# Patient Record
Sex: Female | Born: 1955 | Race: White | Hispanic: No | Marital: Single | Smoking: Current every day smoker
Health system: Southern US, Community
[De-identification: ages and names within clinical notes are randomized; demographics above are authoritative.]

## PROBLEM LIST (undated history)

## (undated) DIAGNOSIS — K741 Hepatic sclerosis: Secondary | ICD-10-CM

## (undated) DIAGNOSIS — G629 Polyneuropathy, unspecified: Secondary | ICD-10-CM

## (undated) DIAGNOSIS — J45909 Unspecified asthma, uncomplicated: Secondary | ICD-10-CM

## (undated) DIAGNOSIS — J449 Chronic obstructive pulmonary disease, unspecified: Secondary | ICD-10-CM

## (undated) DIAGNOSIS — E079 Disorder of thyroid, unspecified: Secondary | ICD-10-CM

## (undated) HISTORY — PX: RADICAL HYSTERECTOMY: SHX2283

---

## 2014-04-06 LAB — CBC WITH DIFFERENTIAL/PLATELET
Basophil #: 0 10*3/uL (ref 0.0–0.1)
Basophil %: 1 %
Eosinophil #: 0.1 10*3/uL (ref 0.0–0.7)
Eosinophil %: 2.3 %
HCT: 35.4 % (ref 35.0–47.0)
HGB: 11.8 g/dL — ABNORMAL LOW (ref 12.0–16.0)
Lymphocyte #: 1.3 10*3/uL (ref 1.0–3.6)
Lymphocyte %: 26 %
MCH: 32.1 pg (ref 26.0–34.0)
MCHC: 33.5 g/dL (ref 32.0–36.0)
MCV: 96 fL (ref 80–100)
MONOS PCT: 7.2 %
Monocyte #: 0.4 x10 3/mm (ref 0.2–0.9)
NEUTROS ABS: 3.2 10*3/uL (ref 1.4–6.5)
NEUTROS PCT: 63.5 %
Platelet: 191 10*3/uL (ref 150–440)
RBC: 3.69 10*6/uL — ABNORMAL LOW (ref 3.80–5.20)
RDW: 14.6 % — ABNORMAL HIGH (ref 11.5–14.5)
WBC: 5 10*3/uL (ref 3.6–11.0)

## 2014-04-06 LAB — URINALYSIS, COMPLETE
Bacteria: NONE SEEN
Bilirubin,UR: NEGATIVE
Blood: NEGATIVE
Glucose,UR: NEGATIVE mg/dL (ref 0–75)
KETONE: NEGATIVE
LEUKOCYTE ESTERASE: NEGATIVE
NITRITE: NEGATIVE
PROTEIN: NEGATIVE
Ph: 6 (ref 4.5–8.0)
RBC,UR: NONE SEEN /HPF (ref 0–5)
Specific Gravity: 1.008 (ref 1.003–1.030)
Squamous Epithelial: NONE SEEN

## 2014-04-06 LAB — COMPREHENSIVE METABOLIC PANEL
ALT: 26 U/L (ref 12–78)
Albumin: 3.3 g/dL — ABNORMAL LOW (ref 3.4–5.0)
Alkaline Phosphatase: 95 U/L
Anion Gap: 9 (ref 7–16)
BUN: 12 mg/dL (ref 7–18)
Bilirubin,Total: 0.3 mg/dL (ref 0.2–1.0)
Calcium, Total: 8.3 mg/dL — ABNORMAL LOW (ref 8.5–10.1)
Chloride: 100 mmol/L (ref 98–107)
Co2: 22 mmol/L (ref 21–32)
Creatinine: 0.7 mg/dL (ref 0.60–1.30)
EGFR (Non-African Amer.): >60
Glucose: 89 mg/dL (ref 65–99)
Osmolality: 262 (ref 275–301)
POTASSIUM: 3.7 mmol/L (ref 3.5–5.1)
SGOT(AST): 42 U/L — ABNORMAL HIGH (ref 15–37)
Sodium: 131 mmol/L — ABNORMAL LOW (ref 136–145)
Total Protein: 6.7 g/dL (ref 6.4–8.2)

## 2014-04-06 LAB — PROTIME-INR
INR: 1.2
Prothrombin Time: 15.2 secs — ABNORMAL HIGH (ref 11.5–14.7)

## 2014-04-06 LAB — DRUG SCREEN, URINE

## 2014-04-06 LAB — APTT: Activated PTT: 30.9 secs (ref 23.6–35.9)

## 2014-04-06 LAB — OSMOLALITY: Osmolality: 322 mOsm/kg (ref 275–295)

## 2014-04-06 LAB — ETHANOL
Ethanol %: 0.24 % — ABNORMAL HIGH (ref 0.000–0.080)
Ethanol: 240 mg/dL

## 2014-04-06 LAB — TROPONIN I: Troponin-I: <0.02 ng/mL

## 2014-04-06 LAB — AMMONIA: Ammonia, Plasma: 78 mcmol/L — ABNORMAL HIGH (ref 11–32)

## 2014-04-07 ENCOUNTER — Inpatient Hospital Stay: Payer: Self-pay | Admitting: Internal Medicine

## 2014-04-07 LAB — CK TOTAL AND CKMB (NOT AT ARMC)
CK, TOTAL: 104 U/L
CK, TOTAL: 81 U/L
CK-MB: 1.3 ng/mL (ref 0.5–3.6)
CK-MB: 0.9 ng/mL (ref 0.5–3.6)

## 2014-04-07 LAB — TROPONIN I: Troponin-I: <0.02 ng/mL

## 2014-04-07 LAB — ACETAMINOPHEN LEVEL: Acetaminophen: <2 ug/mL

## 2014-04-07 LAB — SALICYLATE LEVEL: SALICYLATES, SERUM: 2.1 mg/dL

## 2014-04-08 LAB — CBC WITH DIFFERENTIAL/PLATELET
BASOS ABS: 0 10*3/uL (ref 0.0–0.1)
Basophil %: 0.9 %
EOS ABS: 0.1 10*3/uL (ref 0.0–0.7)
EOS PCT: 2.2 %
HCT: 35.7 % (ref 35.0–47.0)
HGB: 11.8 g/dL — AB (ref 12.0–16.0)
Lymphocyte #: 1 10*3/uL (ref 1.0–3.6)
Lymphocyte %: 22.4 %
MCH: 32.2 pg (ref 26.0–34.0)
MCHC: 33.2 g/dL (ref 32.0–36.0)
MCV: 97 fL (ref 80–100)
Monocyte #: 0.3 x10 3/mm (ref 0.2–0.9)
Monocyte %: 7.2 %
NEUTROS PCT: 67.3 %
Neutrophil #: 3 10*3/uL (ref 1.4–6.5)
PLATELETS: 137 10*3/uL — AB (ref 150–440)
RBC: 3.68 10*6/uL — ABNORMAL LOW (ref 3.80–5.20)
RDW: 14.6 % — ABNORMAL HIGH (ref 11.5–14.5)
WBC: 4.4 10*3/uL (ref 3.6–11.0)

## 2014-04-08 LAB — BASIC METABOLIC PANEL
ANION GAP: 2 — AB (ref 7–16)
BUN: 5 mg/dL — ABNORMAL LOW (ref 7–18)
CO2: 23 mmol/L (ref 21–32)
CREATININE: 0.81 mg/dL (ref 0.60–1.30)
Calcium, Total: 7.7 mg/dL — ABNORMAL LOW (ref 8.5–10.1)
Chloride: 110 mmol/L — ABNORMAL HIGH (ref 98–107)
EGFR (Non-African Amer.): >60
Glucose: 80 mg/dL (ref 65–99)
Osmolality: 266 (ref 275–301)
POTASSIUM: 3.3 mmol/L — AB (ref 3.5–5.1)
SODIUM: 135 mmol/L — AB (ref 136–145)

## 2014-04-08 LAB — COMPREHENSIVE METABOLIC PANEL
ALK PHOS: 78 U/L
AST: 25 U/L (ref 15–37)
Albumin: 2.5 g/dL — ABNORMAL LOW (ref 3.4–5.0)
Bilirubin,Total: 0.7 mg/dL (ref 0.2–1.0)
SGPT (ALT): 16 U/L (ref 12–78)
Total Protein: 5.9 g/dL — ABNORMAL LOW (ref 6.4–8.2)

## 2014-04-08 LAB — MAGNESIUM: Magnesium: 1.4 mg/dL — ABNORMAL LOW

## 2014-04-08 LAB — TSH: Thyroid Stimulating Horm: 0.53 u[IU]/mL

## 2014-04-08 LAB — VANCOMYCIN, TROUGH: Vancomycin, Trough: 17 ug/mL (ref 10–20)

## 2014-04-11 LAB — CREATININE, SERUM
Creatinine: 0.84 mg/dL (ref 0.60–1.30)
EGFR (African American): >60
EGFR (Non-African Amer.): >60

## 2014-05-03 ENCOUNTER — Emergency Department: Payer: Self-pay | Admitting: Emergency Medicine

## 2014-05-03 LAB — CBC
HCT: 38 % (ref 35.0–47.0)
HGB: 12.5 g/dL (ref 12.0–16.0)
MCH: 31.9 pg (ref 26.0–34.0)
MCHC: 33 g/dL (ref 32.0–36.0)
MCV: 97 fL (ref 80–100)
Platelet: 147 10*3/uL — ABNORMAL LOW (ref 150–440)
RBC: 3.93 10*6/uL (ref 3.80–5.20)
RDW: 13.6 % (ref 11.5–14.5)
WBC: 4.7 10*3/uL (ref 3.6–11.0)

## 2014-05-03 LAB — COMPREHENSIVE METABOLIC PANEL
ALT: 33 U/L (ref 12–78)
Albumin: 3.8 g/dL (ref 3.4–5.0)
Alkaline Phosphatase: 98 U/L
Anion Gap: 8 (ref 7–16)
BUN: 10 mg/dL (ref 7–18)
Bilirubin,Total: 0.4 mg/dL (ref 0.2–1.0)
CALCIUM: 8.6 mg/dL (ref 8.5–10.1)
CHLORIDE: 100 mmol/L (ref 98–107)
Co2: 23 mmol/L (ref 21–32)
Creatinine: 0.65 mg/dL (ref 0.60–1.30)
EGFR (African American): >60
EGFR (Non-African Amer.): >60
Glucose: 87 mg/dL (ref 65–99)
Osmolality: 261 (ref 275–301)
POTASSIUM: 3.7 mmol/L (ref 3.5–5.1)
SGOT(AST): 27 U/L (ref 15–37)
SODIUM: 131 mmol/L — AB (ref 136–145)
Total Protein: 7.3 g/dL (ref 6.4–8.2)

## 2014-05-03 LAB — DRUG SCREEN, URINE
Amphetamines, Ur Screen: NEGATIVE (ref ?–1000)
Barbiturates, Ur Screen: NEGATIVE (ref ?–200)
Benzodiazepine, Ur Scrn: NEGATIVE (ref ?–200)
Cannabinoid 50 Ng, Ur ~~LOC~~: NEGATIVE (ref ?–50)
Cocaine Metabolite,Ur ~~LOC~~: NEGATIVE (ref ?–300)
MDMA (ECSTASY) UR SCREEN: NEGATIVE (ref ?–500)
METHADONE, UR SCREEN: NEGATIVE (ref ?–300)
Opiate, Ur Screen: NEGATIVE (ref ?–300)
Phencyclidine (PCP) Ur S: NEGATIVE (ref ?–25)
Tricyclic, Ur Screen: NEGATIVE (ref ?–1000)

## 2014-05-03 LAB — URINALYSIS, COMPLETE
BACTERIA: NONE SEEN
Bilirubin,UR: NEGATIVE
Blood: NEGATIVE
Glucose,UR: NEGATIVE mg/dL (ref 0–75)
KETONE: NEGATIVE
LEUKOCYTE ESTERASE: NEGATIVE
NITRITE: NEGATIVE
PH: 6 (ref 4.5–8.0)
PROTEIN: NEGATIVE
SPECIFIC GRAVITY: 1.013 (ref 1.003–1.030)
WBC UR: 1 /HPF (ref 0–5)

## 2014-05-03 LAB — ACETAMINOPHEN LEVEL

## 2014-05-03 LAB — ETHANOL
ETHANOL %: 0.265 % — AB (ref 0.000–0.080)
ETHANOL LVL: 265 mg/dL

## 2014-05-03 LAB — SALICYLATE LEVEL: Salicylates, Serum: 2.5 mg/dL

## 2014-05-25 ENCOUNTER — Emergency Department: Payer: Self-pay | Admitting: Emergency Medicine

## 2014-06-05 ENCOUNTER — Emergency Department (HOSPITAL_COMMUNITY): Payer: Medicaid Other

## 2014-06-05 ENCOUNTER — Encounter (HOSPITAL_COMMUNITY): Payer: Self-pay | Admitting: Emergency Medicine

## 2014-06-05 ENCOUNTER — Emergency Department (HOSPITAL_COMMUNITY)
Admission: EM | Admit: 2014-06-05 | Discharge: 2014-06-05 | Disposition: A | Discharge status: Home / Self Care | Payer: Medicaid Other | Attending: Emergency Medicine | Admitting: Emergency Medicine

## 2014-06-05 DIAGNOSIS — R296 Repeated falls: Secondary | ICD-10-CM | POA: Diagnosis not present

## 2014-06-05 DIAGNOSIS — Z79899 Other long term (current) drug therapy: Secondary | ICD-10-CM | POA: Insufficient documentation

## 2014-06-05 DIAGNOSIS — Z8719 Personal history of other diseases of the digestive system: Secondary | ICD-10-CM | POA: Diagnosis not present

## 2014-06-05 DIAGNOSIS — F17200 Nicotine dependence, unspecified, uncomplicated: Secondary | ICD-10-CM | POA: Insufficient documentation

## 2014-06-05 DIAGNOSIS — Z7901 Long term (current) use of anticoagulants: Secondary | ICD-10-CM | POA: Insufficient documentation

## 2014-06-05 DIAGNOSIS — G608 Other hereditary and idiopathic neuropathies: Secondary | ICD-10-CM | POA: Diagnosis not present

## 2014-06-05 DIAGNOSIS — S79919A Unspecified injury of unspecified hip, initial encounter: Secondary | ICD-10-CM | POA: Insufficient documentation

## 2014-06-05 DIAGNOSIS — S72111A Displaced fracture of greater trochanter of right femur, initial encounter for closed fracture: Secondary | ICD-10-CM

## 2014-06-05 DIAGNOSIS — J449 Chronic obstructive pulmonary disease, unspecified: Secondary | ICD-10-CM | POA: Diagnosis not present

## 2014-06-05 DIAGNOSIS — Y929 Unspecified place or not applicable: Secondary | ICD-10-CM | POA: Insufficient documentation

## 2014-06-05 DIAGNOSIS — IMO0002 Reserved for concepts with insufficient information to code with codable children: Secondary | ICD-10-CM | POA: Insufficient documentation

## 2014-06-05 DIAGNOSIS — S79929A Unspecified injury of unspecified thigh, initial encounter: Secondary | ICD-10-CM | POA: Diagnosis present

## 2014-06-05 DIAGNOSIS — J4489 Other specified chronic obstructive pulmonary disease: Secondary | ICD-10-CM | POA: Insufficient documentation

## 2014-06-05 DIAGNOSIS — Y9389 Activity, other specified: Secondary | ICD-10-CM | POA: Diagnosis not present

## 2014-06-05 DIAGNOSIS — E079 Disorder of thyroid, unspecified: Secondary | ICD-10-CM | POA: Insufficient documentation

## 2014-06-05 DIAGNOSIS — S72109A Unspecified trochanteric fracture of unspecified femur, initial encounter for closed fracture: Secondary | ICD-10-CM | POA: Insufficient documentation

## 2014-06-05 HISTORY — DX: Disorder of thyroid, unspecified: E07.9

## 2014-06-05 HISTORY — DX: Polyneuropathy, unspecified: G62.9

## 2014-06-05 HISTORY — DX: Hepatic sclerosis: K74.1

## 2014-06-05 HISTORY — DX: Chronic obstructive pulmonary disease, unspecified: J44.9

## 2014-06-05 HISTORY — DX: Unspecified asthma, uncomplicated: J45.909

## 2014-06-05 MED ORDER — OXYCODONE-ACETAMINOPHEN 5-325 MG PO TABS: 1.0000 | ORAL_TABLET | ORAL | Status: AC | PRN

## 2014-06-05 MED ORDER — OXYCODONE-ACETAMINOPHEN 5-325 MG PO TABS: 2.0000 | ORAL_TABLET | ORAL | Status: DC | PRN

## 2014-06-05 MED ORDER — OXYCODONE-ACETAMINOPHEN 5-325 MG PO TABS
1.0000 | ORAL_TABLET | Freq: Once | ORAL | Status: AC
  Administered 2014-06-05: 1 via ORAL
  Filled 2014-06-05: qty 1

## 2014-06-05 NOTE — ED Notes (Signed)
Pt states she fell on 7/16 and continues to have pain to right hip since, states xrays done the next day and was told that it was negative for fx, pt uses a walker, pt from assisted living, states Tramadol is not working for pain

## 2014-06-05 NOTE — ED Notes (Signed)
Pt seen at [facility] for fall 10 days ago, co rt hip pain, all scans negative for fracture. Pt states her pain continues.

## 2014-06-05 NOTE — ED Provider Notes (Signed)
CSN: [id number]     Arrival date & time 06/05/14  1609 History   First MD Initiated Contact with Patient 06/05/14 1804     Chief Complaint  Patient presents with  . Hip Pain    rt     (Consider location/radiation/quality/duration/timing/severity/associated sxs/prior Treatment) Patient is a 58 y.o. female presenting with hip pain. The history is provided by the patient.  Hip Pain The current episode started 1 to 4 weeks ago. The problem occurs constantly. Pertinent negatives include no abdominal pain, chills, fever, headaches, nausea, rash or vomiting.   [name] is a 58 y.o. female who presents to the ED with right hip pain tat started 10 days ago after a fall. She was evaluated at [facility] and she states that all the x-rays were negative for fracture. She continues to have pain in the right hip. She states that on occasion her right knee gives out due to an old injury and she falls. When she fell 10 days ago that is what happened. She was walking and her knee gave out and she fell on a hard floor. She has been taking Tramadol for pain which did not help. She was referred to an orthopedic doctor. She is living in assisted living and they have not scheduled her for the follow up yet. The patient states she is out of the Tramadol and the pain is severe in the right hip. Patient states she is unable to walk due to pain.  Arrived here today via EMS.  A staff member from the assisted living facility where the patient lives is here with the patient. The staff member thinks the patient just wants stronger pain medication.   Past Medical History  Diagnosis Date  . Hepatic sclerosis   . Neuropathy   . COPD (chronic obstructive pulmonary disease)   . Asthma   . Thyroid disease    Past Surgical History  Procedure Laterality Date  . Radical hysterectomy     History reviewed. No pertinent family history. History  Substance Use Topics  . Smoking status: Current Every Day Smoker -- 0.50  packs/day for 25 years    Types: Cigarettes  . Smokeless tobacco: Not on file  . Alcohol Use: Yes   OB History   Grav Para Term Preterm Abortions TAB SAB Ect Mult Living                 Review of Systems  Constitutional: Negative for fever and chills.  HENT: Negative.   Eyes: Negative for visual disturbance.  Respiratory: Negative for shortness of breath and wheezing.   Gastrointestinal: Negative for nausea, vomiting and abdominal pain.  Genitourinary: Negative for dysuria and frequency.  Musculoskeletal: Positive for back pain.       Right hip pain that radiates to the right upper back.   Skin: Negative for rash.  Neurological: Negative for dizziness, syncope and headaches.  Psychiatric/Behavioral: Negative for confusion. The patient is not nervous/anxious.       Allergies  Review of patient's allergies indicates no known allergies.  Home Medications   Prior to Admission medications   Medication Sig Start Date End Date Taking? Authorizing Provider  albuterol (PROAIR HFA) 108 (90 BASE) MCG/ACT inhaler Inhale 1-2 puffs into the lungs every 6 (six) hours as needed for wheezing or shortness of breath.   Yes Historical Provider, MD  albuterol (PROVENTIL) (2.5 MG/3ML) 0.083% nebulizer solution Take 2.5 mg by nebulization 4 (four) times daily as needed for wheezing or shortness of breath.  Yes Historical Provider, MD  DiphenhydrAMINE HCl (COMPLETE ALLERGY PO) Take 2 tablets by mouth daily as needed (for allergies).   Yes Historical Provider, MD  docusate sodium (COLACE) 100 MG capsule Take 100 mg by mouth 2 (two) times daily as needed for mild constipation.   Yes Historical Provider, MD  escitalopram (LEXAPRO) 20 MG tablet Take 20 mg by mouth daily.   Yes Historical Provider, MD  gabapentin (NEURONTIN) 600 MG tablet Take 600 mg by mouth 3 (three) times daily.   Yes Historical Provider, MD  levothyroxine (SYNTHROID, LEVOTHROID) 75 MCG tablet Take 75 mcg by mouth daily before  breakfast.   Yes Historical Provider, MD  LORazepam (ATIVAN) 0.5 MG tablet Take 0.5 mg by mouth 3 (three) times daily.   Yes Historical Provider, MD  magnesium oxide (MAG-OX) 400 MG tablet Take 400 mg by mouth 2 (two) times daily.   Yes Historical Provider, MD  QUEtiapine (SEROQUEL) 300 MG tablet Take 300 mg by mouth at bedtime.   Yes Historical Provider, MD  rOPINIRole (REQUIP) 0.5 MG tablet Take 0.5 mg by mouth at bedtime.   Yes Historical Provider, MD  Tiotropium Bromide Monohydrate (SPIRIVA RESPIMAT) 2.5 MCG/ACT AERS Inhale 2 puffs into the lungs daily.   Yes Historical Provider, MD  traMADol (ULTRAM) 50 MG tablet Take 50 mg by mouth every 4 (four) hours.   Yes Historical Provider, MD  warfarin (COUMADIN) 5 MG tablet Take 5-7.5 mg by mouth See admin instructions. Takes one tablet (5mg  total) on Tuesdays, Thursdays, and Saturdays, then takes one and one-half tablet on Mondays, Wednesdays, and Fridays.   Yes Historical Provider, MD   BP 150/97  Pulse 69  Temp(Src) 98.6 F (37 C) (Oral)  Resp 17  Ht 5\' 5"  (1.651 m)  Wt 136 lb (61.689 kg)  BMI 22.63 kg/m2  SpO2 97% Physical Exam  Nursing note and vitals reviewed. Constitutional: She is oriented to person, place, and time. She appears well-developed and well-nourished.  HENT:  Head: Normocephalic and atraumatic.  Eyes: Conjunctivae and EOM are normal.  Neck: Neck supple.  Cardiovascular: Normal rate.   Pulmonary/Chest: Effort normal.  Abdominal: Soft. Bowel sounds are normal. There is no tenderness.  Musculoskeletal:       Right hip: She exhibits decreased range of motion, decreased strength, tenderness and bony tenderness.  Patient has severe pain in the right hip with flexion of the knee and with ranger of motion. Pedal pulses equal, adequate circulation, good touch sensation.   Neurological: She is alert and oriented to person, place, and time. No cranial nerve deficit or sensory deficit. Gait abnormal.  Requires a walker for  ambulation due to pain of right hip. Walks with limp.   Skin: Skin is warm and dry.  Psychiatric: She has a normal mood and affect. Her behavior is normal.    ED Course  Procedures  Ct Hip Right Wo Contrast  06/05/2014   CLINICAL DATA:  Right hip pain secondary to a fall on 05/25/2014.  EXAM: CT OF THE RIGHT HIP WITHOUT CONTRAST  TECHNIQUE: Multidetector CT imaging was performed according to the standard protocol. Multiplanar CT image reconstructions were also generated.  COMPARISON:  Radiographs dated 05/25/2014  FINDINGS: There is a comminuted minimally displaced fracture of the superior aspect of right greater trochanter. The fracture does not extend through the intertrochanteric region. The femoral neck and head and acetabulum are normal.  There is some edema/hemorrhage in the soft tissues around the fracture.  IMPRESSION: Comminuted minimally displaced fracture of  the tip of the right greater trochanter.   Electronically Signed   By: Geanie Cooley M.D.   On: 06/05/2014 19:17    MDM  58 y.o. female with right hip pain s/p fall 10 days ago. Will treat for pain for fracture of the greater trochanter. She is to follow up with Dr. Hilda Lias. Pain management, walker to assist with ambulation, limited weight bearing. Discussed with the patient and all questioned fully answered. She will return if any problems arise. Stable for return to assisted living. BP 148/81  Pulse 55  Temp(Src) 98.6 F (37 C) (Oral)  Resp 18  Ht 5\' 5"  (1.651 m)  Wt 136 lb (61.689 kg)  BMI 22.63 kg/m2  SpO2 96%    Medication List    TAKE these medications       oxyCODONE-acetaminophen 5-325 MG per tablet  Commonly known as:  PERCOCET/ROXICET  Take 2 tablets by mouth every 4 (four) hours as needed for moderate pain or severe pain.     oxyCODONE-acetaminophen 5-325 MG per tablet  Commonly known as:  ROXICET  Take 1 tablet by mouth every 4 (four) hours as needed for severe pain.      ASK your doctor about these  medications       COMPLETE ALLERGY PO  Take 2 tablets by mouth daily as needed (for allergies).     docusate sodium 100 MG capsule  Commonly known as:  COLACE  Take 100 mg by mouth 2 (two) times daily as needed for mild constipation.     escitalopram 20 MG tablet  Commonly known as:  LEXAPRO  Take 20 mg by mouth daily.     gabapentin 600 MG tablet  Commonly known as:  NEURONTIN  Take 600 mg by mouth 3 (three) times daily.     levothyroxine 75 MCG tablet  Commonly known as:  SYNTHROID, LEVOTHROID  Take 75 mcg by mouth daily before breakfast.     LORazepam 0.5 MG tablet  Commonly known as:  ATIVAN  Take 0.5 mg by mouth 3 (three) times daily.     magnesium oxide 400 MG tablet  Commonly known as:  MAG-OX  Take 400 mg by mouth 2 (two) times daily.     PROAIR HFA 108 (90 BASE) MCG/ACT inhaler  Generic drug:  albuterol  Inhale 1-2 puffs into the lungs every 6 (six) hours as needed for wheezing or shortness of breath.     albuterol (2.5 MG/3ML) 0.083% nebulizer solution  Commonly known as:  PROVENTIL  Take 2.5 mg by nebulization 4 (four) times daily as needed for wheezing or shortness of breath.     QUEtiapine 300 MG tablet  Commonly known as:  SEROQUEL  Take 300 mg by mouth at bedtime.     rOPINIRole 0.5 MG tablet  Commonly known as:  REQUIP  Take 0.5 mg by mouth at bedtime.     SPIRIVA RESPIMAT 2.5 MCG/ACT Aers  Generic drug:  Tiotropium Bromide Monohydrate  Inhale 2 puffs into the lungs daily.     traMADol 50 MG tablet  Commonly known as:  ULTRAM  Take 50 mg by mouth every 4 (four) hours.     warfarin 5 MG tablet  Commonly known as:  COUMADIN  Take 5-7.5 mg by mouth See admin instructions. Takes one tablet (5mg  total) on Tuesdays, Thursdays, and Saturdays, then takes one and one-half tablet on Mondays, Wednesdays, and Fridays.           [facility] Orlene Och, [place] 06/05/14  2234 

## 2014-06-06 NOTE — ED Provider Notes (Signed)
  Medical screening examination/treatment/procedure(s) were performed by non-physician practitioner and as supervising physician I was immediately available for consultation/collaboration.   EKG Interpretation None         Gerhard Munchobert Jahan Friedlander, MD 06/06/14 0009

## 2014-06-12 ENCOUNTER — Other Ambulatory Visit (HOSPITAL_COMMUNITY): Payer: Self-pay | Admitting: Adult Health

## 2014-06-12 DIAGNOSIS — I81 Portal vein thrombosis: Secondary | ICD-10-CM

## 2014-06-15 ENCOUNTER — Encounter (HOSPITAL_COMMUNITY): Payer: Self-pay

## 2014-06-15 ENCOUNTER — Ambulatory Visit (HOSPITAL_COMMUNITY)
Admission: RE | Admit: 2014-06-15 | Discharge: 2014-06-15 | Disposition: A | Discharge status: Home / Self Care | Payer: Medicaid Other | Source: Ambulatory Visit | Attending: *Deleted | Admitting: *Deleted

## 2014-06-15 DIAGNOSIS — K766 Portal hypertension: Secondary | ICD-10-CM

## 2014-06-15 DIAGNOSIS — I81 Portal vein thrombosis: Secondary | ICD-10-CM

## 2014-06-15 MED ORDER — IOHEXOL 300 MG/ML  SOLN
100.0000 mL | Freq: Once | INTRAMUSCULAR | Status: AC | PRN
  Administered 2014-06-15: 100 mL via INTRAVENOUS

## 2014-06-15 NOTE — Progress Notes (Signed)
AFTER INJECTION OF IV CONTRAST PATIENT HAD A SMALL LESS THAN 10ML SALINE INFILTRATE ON POSTERIOR LEFT HAND, PT DID NOT COMPLAIN OF PAIN. TOLD PATIENT THAT IF SHE STARTS TO HAVE PAIN OR SWELLING TO PLEASE GIVE US A CALL

## 2014-06-16 ENCOUNTER — Inpatient Hospital Stay (HOSPITAL_COMMUNITY)
Admission: EM | Admit: 2014-06-16 | Discharge: 2014-06-18 | DRG: 871 | Disposition: A | Discharge status: Skilled Nursing Facility | Payer: Medicaid Other | Attending: Internal Medicine | Admitting: Internal Medicine

## 2014-06-16 ENCOUNTER — Emergency Department (HOSPITAL_COMMUNITY): Payer: Medicaid Other

## 2014-06-16 ENCOUNTER — Inpatient Hospital Stay (HOSPITAL_COMMUNITY): Payer: Medicaid Other

## 2014-06-16 ENCOUNTER — Encounter (HOSPITAL_COMMUNITY): Payer: Self-pay | Admitting: Emergency Medicine

## 2014-06-16 DIAGNOSIS — D72829 Elevated white blood cell count, unspecified: Secondary | ICD-10-CM

## 2014-06-16 DIAGNOSIS — Z86718 Personal history of other venous thrombosis and embolism: Secondary | ICD-10-CM

## 2014-06-16 DIAGNOSIS — E871 Hypo-osmolality and hyponatremia: Secondary | ICD-10-CM | POA: Diagnosis present

## 2014-06-16 DIAGNOSIS — R6521 Severe sepsis with septic shock: Secondary | ICD-10-CM

## 2014-06-16 DIAGNOSIS — F17200 Nicotine dependence, unspecified, uncomplicated: Secondary | ICD-10-CM | POA: Diagnosis present

## 2014-06-16 DIAGNOSIS — J189 Pneumonia, unspecified organism: Secondary | ICD-10-CM | POA: Diagnosis present

## 2014-06-16 DIAGNOSIS — G589 Mononeuropathy, unspecified: Secondary | ICD-10-CM | POA: Diagnosis present

## 2014-06-16 DIAGNOSIS — A419 Sepsis, unspecified organism: Secondary | ICD-10-CM | POA: Diagnosis not present

## 2014-06-16 DIAGNOSIS — Z7901 Long term (current) use of anticoagulants: Secondary | ICD-10-CM

## 2014-06-16 DIAGNOSIS — J449 Chronic obstructive pulmonary disease, unspecified: Secondary | ICD-10-CM | POA: Diagnosis present

## 2014-06-16 DIAGNOSIS — R6520 Severe sepsis without septic shock: Secondary | ICD-10-CM

## 2014-06-16 DIAGNOSIS — E079 Disorder of thyroid, unspecified: Secondary | ICD-10-CM | POA: Diagnosis present

## 2014-06-16 DIAGNOSIS — R42 Dizziness and giddiness: Secondary | ICD-10-CM | POA: Diagnosis not present

## 2014-06-16 DIAGNOSIS — J4489 Other specified chronic obstructive pulmonary disease: Secondary | ICD-10-CM | POA: Diagnosis present

## 2014-06-16 LAB — COMPREHENSIVE METABOLIC PANEL
ALBUMIN: 3.3 g/dL — AB (ref 3.5–5.2)
ALT: 71 U/L — ABNORMAL HIGH (ref 0–35)
AST: 75 U/L — AB (ref 0–37)
Alkaline Phosphatase: 116 U/L (ref 39–117)
Anion gap: 14 (ref 5–15)
BUN: 19 mg/dL (ref 6–23)
CALCIUM: 8.5 mg/dL (ref 8.4–10.5)
CO2: 20 meq/L (ref 19–32)
CREATININE: 1.82 mg/dL — AB (ref 0.50–1.10)
Chloride: 98 mEq/L (ref 96–112)
GFR calc Af Amer: 35 mL/min — ABNORMAL LOW (ref >90)
GFR, EST NON AFRICAN AMERICAN: 30 mL/min — AB (ref >90)
Glucose, Bld: 127 mg/dL — ABNORMAL HIGH (ref 70–99)
Potassium: 4.6 mEq/L (ref 3.7–5.3)
Sodium: 132 mEq/L — ABNORMAL LOW (ref 137–147)
TOTAL PROTEIN: 6.6 g/dL (ref 6.0–8.3)
Total Bilirubin: 0.7 mg/dL (ref 0.3–1.2)

## 2014-06-16 LAB — URINALYSIS, ROUTINE W REFLEX MICROSCOPIC
Bilirubin Urine: NEGATIVE
GLUCOSE, UA: NEGATIVE mg/dL
Hgb urine dipstick: NEGATIVE
Ketones, ur: NEGATIVE mg/dL
Leukocytes, UA: NEGATIVE
NITRITE: NEGATIVE
PH: 5.5 (ref 5.0–8.0)
Protein, ur: NEGATIVE mg/dL
SPECIFIC GRAVITY, URINE: 1.01 (ref 1.005–1.030)
Urobilinogen, UA: 0.2 mg/dL (ref 0.0–1.0)

## 2014-06-16 LAB — CBC WITH DIFFERENTIAL/PLATELET
BASOS ABS: 0 10*3/uL (ref 0.0–0.1)
BASOS PCT: 0 % (ref 0–1)
EOS PCT: 0 % (ref 0–5)
Eosinophils Absolute: 0 10*3/uL (ref 0.0–0.7)
HEMATOCRIT: 37.3 % (ref 36.0–46.0)
Hemoglobin: 13 g/dL (ref 12.0–15.0)
Lymphocytes Relative: 2 % — ABNORMAL LOW (ref 12–46)
Lymphs Abs: 0.3 10*3/uL — ABNORMAL LOW (ref 0.7–4.0)
MCH: 32 pg (ref 26.0–34.0)
MCHC: 34.9 g/dL (ref 30.0–36.0)
MCV: 91.9 fL (ref 78.0–100.0)
Monocytes Absolute: 0.4 10*3/uL (ref 0.1–1.0)
Monocytes Relative: 3 % (ref 3–12)
Neutro Abs: 14.3 10*3/uL — ABNORMAL HIGH (ref 1.7–7.7)
Neutrophils Relative %: 95 % — ABNORMAL HIGH (ref 43–77)
PLATELETS: 135 10*3/uL — AB (ref 150–400)
RBC: 4.06 MIL/uL (ref 3.87–5.11)
RDW: 13.9 % (ref 11.5–15.5)
WBC: 15 10*3/uL — ABNORMAL HIGH (ref 4.0–10.5)

## 2014-06-16 LAB — BLOOD GAS, ARTERIAL
Acid-base deficit: 3.4 mmol/L — ABNORMAL HIGH (ref 0.0–2.0)
Bicarbonate: 20.4 mEq/L (ref 20.0–24.0)
Drawn by: 27407
FIO2: 0.21 %
O2 SAT: 93.9 %
PH ART: 7.42 (ref 7.350–7.450)
PO2 ART: 68 mmHg — AB (ref 80.0–100.0)
Patient temperature: 37
TCO2: 18.5 mmol/L (ref 0–100)
pCO2 arterial: 32 mmHg — ABNORMAL LOW (ref 35.0–45.0)

## 2014-06-16 LAB — PROTIME-INR
INR: 2.48 — AB (ref 0.00–1.49)
Prothrombin Time: 26.8 seconds — ABNORMAL HIGH (ref 11.6–15.2)

## 2014-06-16 LAB — RAPID URINE DRUG SCREEN, HOSP PERFORMED
AMPHETAMINES: NOT DETECTED
BENZODIAZEPINES: NOT DETECTED
Barbiturates: NOT DETECTED
Cocaine: NOT DETECTED
Opiates: NOT DETECTED
Tetrahydrocannabinol: NOT DETECTED

## 2014-06-16 LAB — TSH: TSH: 1.19 u[IU]/mL (ref 0.350–4.500)

## 2014-06-16 LAB — MRSA PCR SCREENING: MRSA by PCR: NEGATIVE

## 2014-06-16 LAB — HIV ANTIBODY (ROUTINE TESTING W REFLEX): HIV: NONREACTIVE

## 2014-06-16 LAB — LACTIC ACID, PLASMA: Lactic Acid, Venous: 1.7 mmol/L (ref 0.5–2.2)

## 2014-06-16 MED ORDER — ESCITALOPRAM OXALATE 10 MG PO TABS
20.0000 mg | ORAL_TABLET | Freq: Every day | ORAL | Status: DC
  Administered 2014-06-17 – 2014-06-18 (×2): 20 mg via ORAL
  Filled 2014-06-16: qty 2
  Filled 2014-06-16 (×2): qty 1
  Filled 2014-06-16: qty 2
  Filled 2014-06-16: qty 1

## 2014-06-16 MED ORDER — ACETAMINOPHEN 650 MG RE SUPP: 650.0000 mg | Freq: Four times a day (QID) | RECTAL | Status: DC | PRN

## 2014-06-16 MED ORDER — DEXTROSE 5 % IV SOLN
2.0000 g | INTRAVENOUS | Status: DC
  Administered 2014-06-16: 2 g via INTRAVENOUS
  Filled 2014-06-16: qty 2

## 2014-06-16 MED ORDER — SODIUM CHLORIDE 0.9 % IJ SOLN
3.0000 mL | Freq: Two times a day (BID) | INTRAMUSCULAR | Status: DC
  Administered 2014-06-16 – 2014-06-18 (×3): 3 mL via INTRAVENOUS

## 2014-06-16 MED ORDER — VANCOMYCIN HCL IN DEXTROSE 750-5 MG/150ML-% IV SOLN
750.0000 mg | INTRAVENOUS | Status: DC
  Filled 2014-06-16 (×2): qty 150

## 2014-06-16 MED ORDER — ONDANSETRON HCL 4 MG/2ML IJ SOLN: 4.0000 mg | Freq: Four times a day (QID) | INTRAMUSCULAR | Status: DC | PRN

## 2014-06-16 MED ORDER — GABAPENTIN 300 MG PO CAPS
600.0000 mg | ORAL_CAPSULE | Freq: Three times a day (TID) | ORAL | Status: DC
  Administered 2014-06-16 – 2014-06-18 (×6): 600 mg via ORAL
  Filled 2014-06-16 (×12): qty 2

## 2014-06-16 MED ORDER — HEPARIN SODIUM (PORCINE) 5000 UNIT/ML IJ SOLN
5000.0000 [IU] | Freq: Three times a day (TID) | INTRAMUSCULAR | Status: DC
  Administered 2014-06-16 – 2014-06-17 (×2): 5000 [IU] via SUBCUTANEOUS
  Filled 2014-06-16 (×2): qty 1

## 2014-06-16 MED ORDER — LORAZEPAM 0.5 MG PO TABS
0.5000 mg | ORAL_TABLET | Freq: Three times a day (TID) | ORAL | Status: DC
  Administered 2014-06-16 – 2014-06-18 (×6): 0.5 mg via ORAL
  Filled 2014-06-16 (×6): qty 1

## 2014-06-16 MED ORDER — GABAPENTIN 300 MG PO CAPS
ORAL_CAPSULE | ORAL | Status: AC
  Filled 2014-06-16: qty 2

## 2014-06-16 MED ORDER — MORPHINE SULFATE 2 MG/ML IJ SOLN
2.0000 mg | INTRAMUSCULAR | Status: DC | PRN
  Administered 2014-06-16 – 2014-06-18 (×3): 2 mg via INTRAVENOUS
  Filled 2014-06-16 (×3): qty 1

## 2014-06-16 MED ORDER — DEXTROSE 5 % IV SOLN
INTRAVENOUS | Status: AC
  Filled 2014-06-16: qty 2

## 2014-06-16 MED ORDER — DIPHENHYDRAMINE HCL 50 MG/ML IJ SOLN
25.0000 mg | Freq: Four times a day (QID) | INTRAMUSCULAR | Status: DC | PRN
  Administered 2014-06-16 – 2014-06-17 (×2): 25 mg via INTRAVENOUS
  Filled 2014-06-16 (×3): qty 1

## 2014-06-16 MED ORDER — ROPINIROLE HCL 1 MG PO TABS
ORAL_TABLET | ORAL | Status: AC
  Filled 2014-06-16: qty 1

## 2014-06-16 MED ORDER — QUETIAPINE FUMARATE 100 MG PO TABS
ORAL_TABLET | ORAL | Status: AC
  Filled 2014-06-16: qty 3

## 2014-06-16 MED ORDER — QUETIAPINE FUMARATE 100 MG PO TABS
300.0000 mg | ORAL_TABLET | Freq: Every day | ORAL | Status: DC
Start: 2014-06-16 — End: 2014-06-18
  Administered 2014-06-16 – 2014-06-17 (×2): 300 mg via ORAL
  Filled 2014-06-16: qty 1
  Filled 2014-06-16: qty 3
  Filled 2014-06-16: qty 1

## 2014-06-16 MED ORDER — ACETAMINOPHEN 325 MG PO TABS
650.0000 mg | ORAL_TABLET | Freq: Four times a day (QID) | ORAL | Status: DC | PRN
  Administered 2014-06-16 – 2014-06-17 (×2): 650 mg via ORAL
  Filled 2014-06-16 (×2): qty 2

## 2014-06-16 MED ORDER — DEXTROSE 5 % IV SOLN
1.0000 g | Freq: Three times a day (TID) | INTRAVENOUS | Status: DC
  Filled 2014-06-16 (×6): qty 1

## 2014-06-16 MED ORDER — VANCOMYCIN HCL IN DEXTROSE 1-5 GM/200ML-% IV SOLN
1000.0000 mg | Freq: Once | INTRAVENOUS | Status: AC
  Administered 2014-06-16: 1000 mg via INTRAVENOUS
  Filled 2014-06-16: qty 200

## 2014-06-16 MED ORDER — LEVOTHYROXINE SODIUM 75 MCG PO TABS
75.0000 ug | ORAL_TABLET | Freq: Every day | ORAL | Status: DC
  Administered 2014-06-17 – 2014-06-18 (×2): 75 ug via ORAL
  Filled 2014-06-16 (×4): qty 1

## 2014-06-16 MED ORDER — ROPINIROLE HCL 0.25 MG PO TABS
0.5000 mg | ORAL_TABLET | Freq: Every day | ORAL | Status: DC
  Administered 2014-06-16 – 2014-06-17 (×2): 0.5 mg via ORAL
  Filled 2014-06-16: qty 1
  Filled 2014-06-16 (×2): qty 2
  Filled 2014-06-16: qty 1

## 2014-06-16 MED ORDER — ONDANSETRON HCL 4 MG PO TABS: 4.0000 mg | ORAL_TABLET | Freq: Four times a day (QID) | ORAL | Status: DC | PRN

## 2014-06-16 MED ORDER — SODIUM CHLORIDE 0.9 % IV SOLN
INTRAVENOUS | Status: DC
  Administered 2014-06-17 – 2014-06-18 (×4): via INTRAVENOUS

## 2014-06-16 MED ORDER — DEXTROSE 5 % IV SOLN
INTRAVENOUS | Status: AC
  Filled 2014-06-16 (×2): qty 1

## 2014-06-16 NOTE — ED Notes (Addendum)
Notified pt that I spoke with her POA and legal guardian and that her POA was consenting to her being admitted.   Pt cooperative at this time.

## 2014-06-16 NOTE — Progress Notes (Signed)
ANTIBIOTIC CONSULT NOTE - INITIAL  Pharmacy Consult for Cefepime & Vancomycin Indication: pneumonia  No Known Allergies  Patient Measurements: Height: 5\' 5"  (165.1 cm) Weight: 132 lb (59.875 kg) IBW/kg (Calculated) : 57 Adjusted Body Weight:   Vital Signs: Temp: 97.8 F (36.6 C) (08/08 0829) Temp src: Oral (08/08 0829) BP: 75/52 mmHg (08/08 1330) Pulse Rate: 72 (08/08 1430) Intake/Output from previous day:   Intake/Output from this shift: Total I/O In: -  Out: 13 [Urine:13]  Labs:  Recent Labs  06/16/14 0928  WBC 15.0*  HGB 13.0  PLT 135*  CREATININE 1.82*   Estimated Creatinine Clearance: 31.4 ml/min (by C-G formula based on Cr of 1.82). No results found for this basename: VANCOTROUGH, VANCOPEAK, VANCORANDOM, GENTTROUGH, GENTPEAK, GENTRANDOM, TOBRATROUGH, TOBRAPEAK, TOBRARND, AMIKACINPEAK, AMIKACINTROU, AMIKACIN,  in the last 72 hours   Microbiology: No results found for this or any previous visit (from the past 720 hour(s)).  Medical History: Past Medical History  Diagnosis Date  . Hepatic sclerosis   . Neuropathy   . COPD (chronic obstructive pulmonary disease)   . Asthma   . Thyroid disease     Medications:  Scheduled:  . [START ON 06/17/2014] vancomycin  750 mg Intravenous Q24H   Assessment: ED patient HCAP, sepsis CrCl approximately 31.4 ml/min  Goal of Therapy:  Vancomycin trough level 15-20 mcg/ml  Plan:  Vancomycin 1 GM IV loading dose, then 750 mg IV every 24 hours Cefepime 2 GM IV every 24 hours Vancomycin trough at steady state Labs per protocol  Raquel JamesPittman, Odilon Cass Bennett 06/16/2014,2:50 PM

## 2014-06-16 NOTE — ED Provider Notes (Signed)
CSN: [id number]     Arrival date & time 06/16/14  [id number] History   First MD Initiated Contact with Patient 06/16/14 [phone]     Chief Complaint  Patient presents with  . Fall     (Consider location/radiation/quality/duration/timing/severity/associated sxs/prior Treatment) Patient is a 58 y.o. female presenting with fall. The history is provided by the patient and medical records.  Fall This is a new problem. The current episode started today. The problem has been gradually improving. She has tried nothing for the symptoms.   [name] is a 58 y.o. female who presents to the ED via EMS after a fall. She states she stood up and felt dizzy. She reached for her walker and fell. She doesn't remember what she hit when she fell. She states she doesn't remember much after that. PMH significant for hip fracture last month and is in assisted living. She has 2 separate charts in the system. She has a long history of substance abuse. She drinks hand sanitizer.  Past Medical History  Diagnosis Date  . Hepatic sclerosis   . Neuropathy   . COPD (chronic obstructive pulmonary disease)   . Asthma   . Thyroid disease    Past Surgical History  Procedure Laterality Date  . Radical hysterectomy     No family history on file. History  Substance Use Topics  . Smoking status: Current Every Day Smoker -- 0.50 packs/day for 25 years    Types: Cigarettes  . Smokeless tobacco: Not on file  . Alcohol Use: Yes   OB History   Grav Para Term Preterm Abortions TAB SAB Ect Mult Living                 Review of Systems Difficulty obtaining ROS due to patient sleeping and having to wake her for each question. She states she hurts all over but has chronic neck and back pain.  @ 11:20 after patient returned from x-ray she states that she had a seizure about a month ago and was not started on any medication. She has had a cough but not sure for how long. She feels dizzy today. She denies fever but has had chills  and complains of being cold now. She states that her neck and back hurts and she recently had a hip injury and is in assisted living.   Allergies  Review of patient's allergies indicates no known allergies.  Home Medications   Prior to Admission medications   Medication Sig Start Date End Date Taking? Authorizing Provider  albuterol (PROAIR HFA) 108 (90 BASE) MCG/ACT inhaler Inhale 1-2 puffs into the lungs every 6 (six) hours as needed for wheezing or shortness of breath.   Yes Historical Provider, MD  albuterol (PROVENTIL) (2.5 MG/3ML) 0.083% nebulizer solution Take 2.5 mg by nebulization 4 (four) times daily as needed for wheezing or shortness of breath.   Yes Historical Provider, MD  DiphenhydrAMINE HCl (COMPLETE ALLERGY PO) Take 2 tablets by mouth daily as needed (for allergies).   Yes Historical Provider, MD  docusate sodium (COLACE) 100 MG capsule Take 100 mg by mouth 2 (two) times daily as needed for mild constipation.   Yes Historical Provider, MD  escitalopram (LEXAPRO) 20 MG tablet Take 20 mg by mouth daily.   Yes Historical Provider, MD  gabapentin (NEURONTIN) 600 MG tablet Take 600 mg by mouth 3 (three) times daily.   Yes Historical Provider, MD  levothyroxine (SYNTHROID, LEVOTHROID) 75 MCG tablet Take 75 mcg by mouth daily before breakfast.  Yes Historical Provider, MD  LORazepam (ATIVAN) 0.5 MG tablet Take 0.5 mg by mouth 3 (three) times daily.   Yes Historical Provider, MD  magnesium oxide (MAG-OX) 400 MG tablet Take 400 mg by mouth 2 (two) times daily.   Yes Historical Provider, MD  oxyCODONE-acetaminophen (ROXICET) 5-325 MG per tablet Take 1 tablet by mouth every 4 (four) hours as needed for severe pain. 06/05/14  Yes Harlin Mazzoni Orlene Och, NP  QUEtiapine (SEROQUEL) 300 MG tablet Take 300 mg by mouth at bedtime.   Yes Historical Provider, MD  rOPINIRole (REQUIP) 0.5 MG tablet Take 0.5 mg by mouth at bedtime.   Yes Historical Provider, MD  Tiotropium Bromide Monohydrate (SPIRIVA RESPIMAT)  2.5 MCG/ACT AERS Inhale 2 puffs into the lungs daily.   Yes Historical Provider, MD  warfarin (COUMADIN) 4 MG tablet Take 8 mg by mouth daily.   Yes Historical Provider, MD   BP 75/52  Pulse 80  Temp(Src) 97.8 F (36.6 C) (Oral)  Resp 16  Ht 5\' 5"  (1.651 m)  Wt 132 lb (59.875 kg)  BMI 21.97 kg/m2  SpO2 98% Physical Exam  Nursing note and vitals reviewed. Constitutional: She is oriented to person, place, and time. She appears well-developed and well-nourished.  Full exam done after patient has had 1000 cc of IV NS and is alert and oriented. She remains Hypotensive. Her O2 Sat drops to 90 while ambulating but goes back up to 95 when sitting in bed. She is able to speak in full sentences without difficulty. With orthostatic vital signs her heart rate goes up by 30 beats.    HENT:  Head: Normocephalic and atraumatic.  Mouth/Throat: Uvula is midline, oropharynx is clear and moist and mucous membranes are normal.  Lips are dry and cracking  Eyes: Conjunctivae and EOM are normal. Pupils are equal, round, and reactive to light.  Neck: Neck supple.  Cardiovascular: Normal rate.   Pulmonary/Chest: She has rales in the right lower field and the left lower field.  Abdominal: Soft. Bowel sounds are normal. There is no tenderness.  Musculoskeletal:  Ambulatory with slight limp due to the right hip fracutre. Full range of motion of knees and ankles without pain. Pedal pulses equal, adequate circulation, good touch sensation.   Neurological: She is alert and oriented to person, place, and time. She has normal strength. No cranial nerve deficit or sensory deficit.  Radial and pedal pulses present. Good grips and equal bilateral.   Skin: Skin is warm and dry.  Psychiatric: She has a normal mood and affect. Her behavior is normal.    ED Course  Procedures (including critical care time) Dr. Adriana Simas and I both spoke with the patient concerning her low blood pressure, the episode she had this morning, her  x-ray results and need for admission. The patient does not want to be admitted. Consult with the hospitalist and he will see the patient in the ED and discuss admission.   Imaging Review Dg Chest 2 View  06/16/2014   CLINICAL DATA:  Low oxygen saturations.  EXAM: CHEST  2 VIEW  COMPARISON:  04/06/2014  FINDINGS: The heart is mildly enlarged but stable. The mediastinal and hilar contours are within normal limits and unchanged. Patchy airspace opacity in the left lower lobe may suggest pneumonia. No pulmonary edema or pleural effusion.  IMPRESSION: Suspect left lower lobe infiltrate.  No edema or effusions.   Electronically Signed   By: Loralie Champagne M.D.   On: 06/16/2014 11:40   Ct Abdomen  W Contrast  06/15/2014   CLINICAL DATA:  Evaluate portal vein thrombosis.  EXAM: CT ABDOMEN WITH CONTRAST  TECHNIQUE: Multidetector CT imaging of the abdomen was performed using the standard protocol following bolus administration of intravenous contrast.  CONTRAST:  OMNIPAQUE IOHEXOL 300 MG/ML  SOLN  COMPARISON:  None  FINDINGS: No pleural effusion identified.  No pericardial effusion.  The lung bases appear clear. Moderate to advanced changes of cirrhosis. No focal liver lesions identified. The portal vein is patent and appears increased in diameter. Left upper quadrant varices noted. Gallbladder appears normal. No biliary dilatation. Normal appearance of the pancreas. The spleen measures 9 cm in length. The adrenal glands are both normal. The left kidney is normal. Cyst within the inferior pole the left kidney are noted measuring up to 1.3 cm. No retroperitoneal adenopathy identified. No ascites. The bowel loops within the upper abdomen appear normal.  Review of the visualized osseous structures is significant for healed fractures involving left eleventh and twelfth posterior ribs.  IMPRESSION: 1. No acute findings within the abdomen. 2. Morphologic features of the liver compatible with cirrhosis with evidence of  portal venous hypertension 3. No evidence for portal vein thrombosis.   Electronically Signed   By: Signa Kell M.D.   On: 06/15/2014 15:43     EKG Interpretation   Date/Time:  Saturday June 16 2014 08:34:31 EDT Ventricular Rate:  80 PR Interval:  167 QRS Duration: 131 QT Interval:  455 QTC Calculation: 525 R Axis:   48 Text Interpretation:  Sinus rhythm Probable left atrial enlargement IVCD,  consider atypical LBBB Confirmed by COOK  MD, BRIAN ([id number]) on 06/16/2014  8:49:47 AM      Results for orders placed during the hospital encounter of 06/16/14 (from the past 24 hour(s))  CBC WITH DIFFERENTIAL     Status: Abnormal   Collection Time    06/16/14  9:28 AM      Result Value Ref Range   WBC 15.0 (*) 4.0 - 10.5 K/uL   RBC 4.06  3.87 - 5.11 MIL/uL   Hemoglobin 13.0  12.0 - 15.0 g/dL   HCT 60.4  54.0 - 98.1 %   MCV 91.9  78.0 - 100.0 fL   MCH 32.0  26.0 - 34.0 pg   MCHC 34.9  30.0 - 36.0 g/dL   RDW 19.1  47.8 - 29.5 %   Platelets 135 (*) 150 - 400 K/uL   Neutrophils Relative % 95 (*) 43 - 77 %   Neutro Abs 14.3 (*) 1.7 - 7.7 K/uL   Lymphocytes Relative 2 (*) 12 - 46 %   Lymphs Abs 0.3 (*) 0.7 - 4.0 K/uL   Monocytes Relative 3  3 - 12 %   Monocytes Absolute 0.4  0.1 - 1.0 K/uL   Eosinophils Relative 0  0 - 5 %   Eosinophils Absolute 0.0  0.0 - 0.7 K/uL   Basophils Relative 0  0 - 1 %   Basophils Absolute 0.0  0.0 - 0.1 K/uL  COMPREHENSIVE METABOLIC PANEL     Status: Abnormal   Collection Time    06/16/14  9:28 AM      Result Value Ref Range   Sodium 132 (*) 137 - 147 mEq/L   Potassium 4.6  3.7 - 5.3 mEq/L   Chloride 98  96 - 112 mEq/L   CO2 20  19 - 32 mEq/L   Glucose, Bld 127 (*) 70 - 99 mg/dL   BUN 19  6 -  23 mg/dL   Creatinine, Ser 9.56 (*) 0.50 - 1.10 mg/dL   Calcium 8.5  8.4 - 21.3 mg/dL   Total Protein 6.6  6.0 - 8.3 g/dL   Albumin 3.3 (*) 3.5 - 5.2 g/dL   AST 75 (*) 0 - 37 U/L   ALT 71 (*) 0 - 35 U/L   Alkaline Phosphatase 116  39 - 117 U/L   Total  Bilirubin 0.7  0.3 - 1.2 mg/dL   GFR calc non Af Amer 30 (*) >90 mL/min   GFR calc Af Amer 35 (*) >90 mL/min   Anion gap 14  5 - 15  URINALYSIS, ROUTINE W REFLEX MICROSCOPIC     Status: Abnormal   Collection Time    06/16/14  9:29 AM      Result Value Ref Range   Color, Urine YELLOW  YELLOW   APPearance HAZY (*) CLEAR   Specific Gravity, Urine 1.010  1.005 - 1.030   pH 5.5  5.0 - 8.0   Glucose, UA NEGATIVE  NEGATIVE mg/dL   Hgb urine dipstick NEGATIVE  NEGATIVE   Bilirubin Urine NEGATIVE  NEGATIVE   Ketones, ur NEGATIVE  NEGATIVE mg/dL   Protein, ur NEGATIVE  NEGATIVE mg/dL   Urobilinogen, UA 0.2  0.0 - 1.0 mg/dL   Nitrite NEGATIVE  NEGATIVE   Leukocytes, UA NEGATIVE  NEGATIVE  URINE RAPID DRUG SCREEN (HOSP PERFORMED)     Status: None   Collection Time    06/16/14  9:29 AM      Result Value Ref Range   Opiates NONE DETECTED  NONE DETECTED   Cocaine NONE DETECTED  NONE DETECTED   Benzodiazepines NONE DETECTED  NONE DETECTED   Amphetamines NONE DETECTED  NONE DETECTED   Tetrahydrocannabinol NONE DETECTED  NONE DETECTED   Barbiturates NONE DETECTED  NONE DETECTED  BLOOD GAS, ARTERIAL     Status: Abnormal   Collection Time    06/16/14  1:50 PM      Result Value Ref Range   FIO2 0.21     Delivery systems ROOM AIR     pH, Arterial 7.420  7.350 - 7.450   pCO2 arterial 32.0 (*) 35.0 - 45.0 mmHg   pO2, Arterial 68.0 (*) 80.0 - 100.0 mmHg   Bicarbonate 20.4  20.0 - 24.0 mEq/L   TCO2 18.5  0 - 100 mmol/L   Acid-base deficit 3.4 (*) 0.0 - 2.0 mmol/L   O2 Saturation 93.9     Patient temperature 37.0     Collection site RIGHT BRACHIAL     Drawn by [phone]     Sample type ARTERIAL DRAW     Allens test (pass/fail) PASS  PASS    Dg Chest 2 View  06/16/2014   CLINICAL DATA:  Low oxygen saturations.  EXAM: CHEST  2 VIEW  COMPARISON:  04/06/2014  FINDINGS: The heart is mildly enlarged but stable. The mediastinal and hilar contours are within normal limits and unchanged. Patchy airspace  opacity in the left lower lobe may suggest pneumonia. No pulmonary edema or pleural effusion.  IMPRESSION: Suspect left lower lobe infiltrate.  No edema or effusions.   Electronically Signed   By: Loralie Champagne M.D.   On: 06/16/2014 11:40   Ct Abdomen W Contrast  06/15/2014   CLINICAL DATA:  Evaluate portal vein thrombosis.  EXAM: CT ABDOMEN WITH CONTRAST  TECHNIQUE: Multidetector CT imaging of the abdomen was performed using the standard protocol following bolus administration of intravenous contrast.  CONTRAST:  OMNIPAQUE IOHEXOL 300 MG/ML  SOLN  COMPARISON:  None  FINDINGS: No pleural effusion identified.  No pericardial effusion.  The lung bases appear clear. Moderate to advanced changes of cirrhosis. No focal liver lesions identified. The portal vein is patent and appears increased in diameter. Left upper quadrant varices noted. Gallbladder appears normal. No biliary dilatation. Normal appearance of the pancreas. The spleen measures 9 cm in length. The adrenal glands are both normal. The left kidney is normal. Cyst within the inferior pole the left kidney are noted measuring up to 1.3 cm. No retroperitoneal adenopathy identified. No ascites. The bowel loops within the upper abdomen appear normal.  Review of the visualized osseous structures is significant for healed fractures involving left eleventh and twelfth posterior ribs.  IMPRESSION: 1. No acute findings within the abdomen. 2. Morphologic features of the liver compatible with cirrhosis with evidence of portal venous hypertension 3. No evidence for portal vein thrombosis.   Electronically Signed   By: Signa Kell M.D.   On: 06/15/2014 15:43    MDM  Dr. Rhona Leavens in to evaluate the patient. He discussed with her the x-ray findings and indication for admission. The patient does not want admission. Call to the facility where the patient resides and they will not accept her back with pneumonia and other problems she currently has. Spoke with the  patient's sister who has power of attorney over the patient and she request admission for the patient. She lives 3 hours away and can not come at this time.  Dr. Rhona Leavens will admit the patient for antibiotic therapy and further evaluation.     [place], [place] 06/16/14 [phone]

## 2014-06-16 NOTE — ED Notes (Signed)
Spoke with [name]/ sister  on the phone.  She is patients legal guardian and has POA.  Pt was deemed incompetent by the states and [name] was given legal guardianship over her.  The decision is being made by the POA for the pt to be admitted.

## 2014-06-16 NOTE — ED Notes (Signed)
sats in the 80's on room air. Placed on [place] at 3 lpm.  Pt very drowsy.  arrouses if you call her name loudly.

## 2014-06-16 NOTE — ED Notes (Signed)
Pt refusing to be admitted

## 2014-06-16 NOTE — Consult Note (Deleted)
[facility] Medical Consultation  [name] JXB:[id number] DOB: [birth year] DOA: 06/16/2014 PCP: No primary provider on file.   Requesting physician: Emergency Department Date of consultation: 06/16/14 Reason for consultation: Sepsis with Pneumonia  Impression/Recommendations Principal Problem:   Septic shock Active Problems:   HCAP (healthcare-associated pneumonia)   Leukocytosis   1. Septic shock secondary to HCAP 1. Pt with marked hypotension, leukocytosis, possible acute renal insufficiency, mild hyponatremia in the setting of LLL infiltrate 2. SBP with a low of 62 and was in the low 80's when pt was seen by me.  3. Pt would benefit greatly from admit to stepdown unit with IV abx and even pressors if bp does not improve with volume 4. Pt is currently alert and oriented x 3 and is completely aware of her medical situation. She has full decision making capacity at this time. 5. Pt has already requested to ED staff and again to me her desire to go back to her facility and not be admitted 6. After answering her questions about her condition, including questions directly related to sepsis, the patient still desired to leave the hospital despite knowing my strong recommendation for inpatient admission. She is fully aware of her low bp, sepsis, and pneumonia, as were shown to her in the room on bedside computer. The patient understands that her condition may, and will likely, further decompensate and that she may very well develop florid septic shock with end organ failure, and even death. The patient still wants to leave the hospital. Updated ED staff. 2. Leukocytosis 1. Per above, likely related to sepsis 3. COPD 1. No active wheezing on exam  Chief Complaint: Fall  HPI:  57yo with hx of COPD who presented from facility s/p fall today. During pt's work up, pt was found to be profoundly hypotensive with leukocytosis and cxr findings suggestive of LLL infiltrate. BP was improved  somewhat with IVF and the hospitalist service consulted for admission.  Review of Systems:  Per above, the remainder of the 10pt ros reviewed and are neg  Past Medical History  Diagnosis Date  . Hepatic sclerosis   . Neuropathy   . COPD (chronic obstructive pulmonary disease)   . Asthma   . Thyroid disease    Past Surgical History  Procedure Laterality Date  . Radical hysterectomy     Social History:  reports that she has been smoking Cigarettes.  She has a 12.5 pack-year smoking history. She does not have any smokeless tobacco history on file. She reports that she drinks alcohol. She reports that she does not use illicit drugs.  No Known Allergies No family history on file.  Prior to Admission medications   Medication Sig Start Date End Date Taking? Authorizing Provider  albuterol (PROAIR HFA) 108 (90 BASE) MCG/ACT inhaler Inhale 1-2 puffs into the lungs every 6 (six) hours as needed for wheezing or shortness of breath.   Yes Historical Provider, MD  albuterol (PROVENTIL) (2.5 MG/3ML) 0.083% nebulizer solution Take 2.5 mg by nebulization 4 (four) times daily as needed for wheezing or shortness of breath.   Yes Historical Provider, MD  DiphenhydrAMINE HCl (COMPLETE ALLERGY PO) Take 2 tablets by mouth daily as needed (for allergies).   Yes Historical Provider, MD  docusate sodium (COLACE) 100 MG capsule Take 100 mg by mouth 2 (two) times daily as needed for mild constipation.   Yes Historical Provider, MD  escitalopram (LEXAPRO) 20 MG tablet Take 20 mg by mouth daily.   Yes Historical Provider, MD  gabapentin (NEURONTIN) 600 MG tablet Take 600 mg by mouth 3 (three) times daily.   Yes Historical Provider, MD  levothyroxine (SYNTHROID, LEVOTHROID) 75 MCG tablet Take 75 mcg by mouth daily before breakfast.   Yes Historical Provider, MD  LORazepam (ATIVAN) 0.5 MG tablet Take 0.5 mg by mouth 3 (three) times daily.   Yes Historical Provider, MD  magnesium oxide (MAG-OX) 400 MG tablet Take  400 mg by mouth 2 (two) times daily.   Yes Historical Provider, MD  oxyCODONE-acetaminophen (ROXICET) 5-325 MG per tablet Take 1 tablet by mouth every 4 (four) hours as needed for severe pain. 06/05/14  Yes Hope Orlene OchM Neese, NP  QUEtiapine (SEROQUEL) 300 MG tablet Take 300 mg by mouth at bedtime.   Yes Historical Provider, MD  rOPINIRole (REQUIP) 0.5 MG tablet Take 0.5 mg by mouth at bedtime.   Yes Historical Provider, MD  Tiotropium Bromide Monohydrate (SPIRIVA RESPIMAT) 2.5 MCG/ACT AERS Inhale 2 puffs into the lungs daily.   Yes Historical Provider, MD  warfarin (COUMADIN) 4 MG tablet Take 8 mg by mouth daily.   Yes Historical Provider, MD   Physical Exam: Blood pressure 75/52, pulse 80, temperature 97.8 F (36.6 C), temperature source Oral, resp. rate 16, height 5\' 5"  (1.651 m), weight 59.875 kg (132 lb), SpO2 98.00%. Filed Vitals:   06/16/14 1130 06/16/14 1200 06/16/14 1300 06/16/14 1330  BP: 62/50 77/54 85/57  75/52  Pulse: 77 75 80   Temp:      TempSrc:      Resp: 12 13 10 16   Height:      Weight:      SpO2: 99% 100% 98%      General:  Awake, in nad  Eyes: PERRL B  ENT: membranes moist, dentition fair  Neck: trachea midline, neck supple  Cardiovascular: regular, s1, s2  Respiratory: normal resp effort, crackles over L lower lung fields, no wheezing  Abdomen: soft, nondistended  Skin: normal skin turgor, no abnormal skin lesions seen  Musculoskeletal: perfused, no clubbing  Psychiatric: mood/affect normal// no auditory/visual hallucinations  Neurologic: cn2-12 grossly intact, strength/sensation intact  Labs on Admission:  Basic Metabolic Panel:  Recent Labs Lab 06/16/14 0928  NA 132*  K 4.6  CL 98  CO2 20  GLUCOSE 127*  BUN 19  CREATININE 1.82*  CALCIUM 8.5   Liver Function Tests:  Recent Labs Lab 06/16/14 0928  AST 75*  ALT 71*  ALKPHOS 116  BILITOT 0.7  PROT 6.6  ALBUMIN 3.3*   No results found for this basename: LIPASE, AMYLASE,  in the last  168 hours No results found for this basename: AMMONIA,  in the last 168 hours CBC:  Recent Labs Lab 06/16/14 0928  WBC 15.0*  NEUTROABS 14.3*  HGB 13.0  HCT 37.3  MCV 91.9  PLT 135*   Cardiac Enzymes: No results found for this basename: CKTOTAL, CKMB, CKMBINDEX, TROPONINI,  in the last 168 hours BNP: No components found with this basename: POCBNP,  CBG: No results found for this basename: GLUCAP,  in the last 168 hours  Radiological Exams on Admission: Dg Chest 2 View  06/16/2014   CLINICAL DATA:  Low oxygen saturations.  EXAM: CHEST  2 VIEW  COMPARISON:  04/06/2014  FINDINGS: The heart is mildly enlarged but stable. The mediastinal and hilar contours are within normal limits and unchanged. Patchy airspace opacity in the left lower lobe may suggest pneumonia. No pulmonary edema or pleural effusion.  IMPRESSION: Suspect left lower lobe infiltrate.  No edema  or effusions.   Electronically Signed   By: Loralie Champagne M.D.   On: 06/16/2014 11:40   Ct Abdomen W Contrast  06/15/2014   CLINICAL DATA:  Evaluate portal vein thrombosis.  EXAM: CT ABDOMEN WITH CONTRAST  TECHNIQUE: Multidetector CT imaging of the abdomen was performed using the standard protocol following bolus administration of intravenous contrast.  CONTRAST:  OMNIPAQUE IOHEXOL 300 MG/ML  SOLN  COMPARISON:  None  FINDINGS: No pleural effusion identified.  No pericardial effusion.  The lung bases appear clear. Moderate to advanced changes of cirrhosis. No focal liver lesions identified. The portal vein is patent and appears increased in diameter. Left upper quadrant varices noted. Gallbladder appears normal. No biliary dilatation. Normal appearance of the pancreas. The spleen measures 9 cm in length. The adrenal glands are both normal. The left kidney is normal. Cyst within the inferior pole the left kidney are noted measuring up to 1.3 cm. No retroperitoneal adenopathy identified. No ascites. The bowel loops within the upper  abdomen appear normal.  Review of the visualized osseous structures is significant for healed fractures involving left eleventh and twelfth posterior ribs.  IMPRESSION: 1. No acute findings within the abdomen. 2. Morphologic features of the liver compatible with cirrhosis with evidence of portal venous hypertension 3. No evidence for portal vein thrombosis.   Electronically Signed   By: Signa Kell M.D.   On: 06/15/2014 15:43    Time spent:  Gerene Nedd K [facility] Pager [phone]  If 7PM-7AM, please contact night-coverage [email or web address] Password [email or web address] 06/16/2014, 1:39 PM

## 2014-06-16 NOTE — H&P (Addendum)
[facility] Medical Consultation   [name] ZOX:[id number] DOB: 15-Jun-1956 DOA: 06/16/2014 PCP: No primary provider on file.    Requesting physician: Emergency Department Date of consultation: 06/16/14 Reason for consultation: Sepsis with Pneumonia   Impression/Recommendations Principal Problem:   Septic shock Active Problems:   HCAP (healthcare-associated pneumonia)   Leukocytosis    Septic shock secondary to HCAP - Pt with marked hypotension, leukocytosis, possible acute renal insufficiency, mild hyponatremia in the setting of LLL infiltrate - SBP with a low of 62 and was in the low 80's when pt was seen by me.   - Pt would benefit greatly from admit to stepdown unit with IV abx and even pressors if bp does not improve with volume - Pt is currently alert and oriented x 3 and is completely aware of her medical situation. She has full decision making capacity at this time. - Pt has already requested to ED staff and again to me her desire to go back to her facility and not be admitted - After answering her questions about her condition, including questions directly related to sepsis, the patient still desired to leave the hospital despite knowing my strong recommendation for inpatient admission. She is fully aware of her low bp, sepsis, and pneumonia, as were shown to her in the room on bedside computer. The patient understands that her condition may, and will likely, further decompensate and that she may very well develop florid septic shock with end organ failure, and even death. The patient still wants to leave the hospital. Updated ED staff.  Leukocytosis - Per above, likely related to sepsis  COPD - No active wheezing on exam  DVT Prophylaxis - Heparin  Chief Complaint: Fall   HPI:  58yo with hx of COPD who presented from facility s/p fall today. During pt's work up, pt was found to be profoundly hypotensive with leukocytosis and cxr findings suggestive of LLL  infiltrate. BP was improved somewhat with IVF and the hospitalist service consulted for admission.   Review of Systems:  Per above, the remainder of the 10pt ros reviewed and are neg    Past Medical History   Diagnosis  Date   .  Hepatic sclerosis     .  Neuropathy     .  COPD (chronic obstructive pulmonary disease)     .  Asthma     .  Thyroid disease      Past Surgical History   Procedure  Laterality  Date   .  Radical hysterectomy        Social History: reports that she has been smoking Cigarettes.  She has a 12.5 pack-year smoking history. She does not have any smokeless tobacco history on file. She reports that she drinks alcohol. She reports that she does not use illicit drugs.   No Known Allergies No family history on file.    Prior to Admission medications    Medication  Sig  Start Date  End Date  Taking?  Authorizing Provider   albuterol (PROAIR HFA) 108 (90 BASE) MCG/ACT inhaler  Inhale 1-2 puffs into the lungs every 6 (six) hours as needed for wheezing or shortness of breath.      Yes  Historical Provider, MD   albuterol (PROVENTIL) (2.5 MG/3ML) 0.083% nebulizer solution  Take 2.5 mg by nebulization 4 (four) times daily as needed for wheezing or shortness of breath.      Yes  Historical Provider, MD   DiphenhydrAMINE HCl (COMPLETE ALLERGY PO)  Take  2 tablets by mouth daily as needed (for allergies).      Yes  Historical Provider, MD   docusate sodium (COLACE) 100 MG capsule  Take 100 mg by mouth 2 (two) times daily as needed for mild constipation.      Yes  Historical Provider, MD   escitalopram (LEXAPRO) 20 MG tablet  Take 20 mg by mouth daily.      Yes  Historical Provider, MD   gabapentin (NEURONTIN) 600 MG tablet  Take 600 mg by mouth 3 (three) times daily.      Yes  Historical Provider, MD   levothyroxine (SYNTHROID, LEVOTHROID) 75 MCG tablet  Take 75 mcg by mouth daily before breakfast.      Yes  Historical Provider, MD   LORazepam (ATIVAN) 0.5 MG tablet  Take 0.5 mg  by mouth 3 (three) times daily.      Yes  Historical Provider, MD   magnesium oxide (MAG-OX) 400 MG tablet  Take 400 mg by mouth 2 (two) times daily.      Yes  Historical Provider, MD   oxyCODONE-acetaminophen (ROXICET) 5-325 MG per tablet  Take 1 tablet by mouth every 4 (four) hours as needed for severe pain.  06/05/14    Yes  Hope Orlene Och, NP   QUEtiapine (SEROQUEL) 300 MG tablet  Take 300 mg by mouth at bedtime.      Yes  Historical Provider, MD   rOPINIRole (REQUIP) 0.5 MG tablet  Take 0.5 mg by mouth at bedtime.      Yes  Historical Provider, MD   Tiotropium Bromide Monohydrate (SPIRIVA RESPIMAT) 2.5 MCG/ACT AERS  Inhale 2 puffs into the lungs daily.      Yes  Historical Provider, MD   warfarin (COUMADIN) 4 MG tablet  Take 8 mg by mouth daily.      Yes  Historical Provider, MD      Physical Exam: Blood pressure 75/52, pulse 80, temperature 97.8 F (36.6 C), temperature source Oral, resp. rate 16, height 5\' 5"  (1.651 m), weight 59.875 kg (132 lb), SpO2 98.00%. Filed Vitals:     06/16/14 1130  06/16/14 1200  06/16/14 1300  06/16/14 1330   BP:  62/50  77/54  85/57  75/52   Pulse:  77  75  80     Temp:           TempSrc:           Resp:  12  13  10  16    Height:           Weight:           SpO2:  99%  100%  98%         General:  Awake, in nad Eyes: PERRL B ENT: membranes moist, dentition fair Neck: trachea midline, neck supple Cardiovascular: regular, s1, s2 Respiratory: normal resp effort, crackles over L lower lung fields, no wheezing Abdomen: soft, nondistended Skin: normal skin turgor, no abnormal skin lesions seen Musculoskeletal: perfused, no clubbing Psychiatric: mood/affect normal// no auditory/visual hallucinations Neurologic: cn2-12 grossly intact, strength/sensation intact   Labs on Admission:  Basic Metabolic Panel: Recent Labs Lab  06/16/14 0928   NA  132*   K  4.6   CL  98   CO2  20   GLUCOSE  127*   BUN  19   CREATININE  1.82*   CALCIUM  8.5    Liver  Function Tests: Recent Labs Lab  06/16/14 [phone]  AST  75*   ALT  71*   ALKPHOS  116   BILITOT  0.7   PROT  6.6   ALBUMIN  3.3*    No results found for this basename: LIPASE, AMYLASE,  in the last 168 hours No results found for this basename: AMMONIA,  in the last 168 hours CBC: Recent Labs Lab  06/16/14 0928   WBC  15.0*   NEUTROABS  14.3*   HGB  13.0   HCT  37.3   MCV  91.9   PLT  135*    Cardiac Enzymes: No results found for this basename: CKTOTAL, CKMB, CKMBINDEX, TROPONINI,  in the last 168 hours BNP: No components found with this basename: POCBNP,  CBG: No results found for this basename: GLUCAP,  in the last 168 hours   Radiological Exams on Admission: Dg Chest 2 View   06/16/2014   CLINICAL DATA:  Low oxygen saturations.  EXAM: CHEST  2 VIEW  COMPARISON:  04/06/2014  FINDINGS: The heart is mildly enlarged but stable. The mediastinal and hilar contours are within normal limits and unchanged. Patchy airspace opacity in the left lower lobe may suggest pneumonia. No pulmonary edema or pleural effusion.  IMPRESSION: Suspect left lower lobe infiltrate.  No edema or effusions.   Electronically Signed   By: Loralie ChampagneMark  Gallerani M.D.   On: 06/16/2014 11:40    Ct Abdomen W Contrast   06/15/2014   CLINICAL DATA:  Evaluate portal vein thrombosis.  EXAM: CT ABDOMEN WITH CONTRAST  TECHNIQUE: Multidetector CT imaging of the abdomen was performed using the standard protocol following bolus administration of intravenous contrast.  CONTRAST:  100mL OMNIPAQUE IOHEXOL 300 MG/ML  SOLN  COMPARISON:  None  FINDINGS: No pleural effusion identified.  No pericardial effusion. The lung bases appear clear. Moderate to advanced changes of cirrhosis. No focal liver lesions identified. The portal vein is patent and appears increased in diameter. Left upper quadrant varices noted. Gallbladder appears normal. No biliary dilatation. Normal appearance of the pancreas. The spleen measures 9 cm in length. The  adrenal glands are both normal. The left kidney is normal. Cyst within the inferior pole the left kidney are noted measuring up to 1.3 cm. No retroperitoneal adenopathy identified. No ascites. The bowel loops within the upper abdomen appear normal.  Review of the visualized osseous structures is significant for healed fractures involving left eleventh and twelfth posterior ribs.  IMPRESSION: 1. No acute findings within the abdomen. 2. Morphologic features of the liver compatible with cirrhosis with evidence of portal venous hypertension 3. No evidence for portal vein thrombosis.   Electronically Signed   By: Signa Kellaylor  Stroud M.D.   On: 06/15/2014 15:43     Time spent: 40min   Kenise Barraco K [facility] Pager [phone]   If 7PM-7AM, please contact night-coverage [email or web address] Password [email or web address] 06/16/2014, 1:39 PM       ADDENDUM: Pt now agreeable for inpatient admission. Will admit patient to stepdown. With empiric IV abx for HCAP and IVF as tolerated.

## 2014-06-16 NOTE — ED Notes (Signed)
Notified hospitalist of sister consenting to admission.

## 2014-06-16 NOTE — ED Notes (Signed)
Pt ambulated with shuffled gait, SPO2 dropped from 96% standing to 90% walking.

## 2014-06-16 NOTE — ED Notes (Signed)
Patient with c/o fall from standing position. C/o pain all over. Chronic neck and back pain as prior history.

## 2014-06-16 NOTE — ED Notes (Signed)
Staff here from [facility].  Per supervisor at home ,  They are refusing to take the patient back -due to pt having a diagnosis of pneumonia.

## 2014-06-16 NOTE — ED Notes (Signed)
Re-called Hospitalist to see Pt for admission. Dr. Rhona Leavenshiu in the Dept to talk with the Pt to stress the need for admission.

## 2014-06-17 LAB — CBC
HEMATOCRIT: 28.9 % — AB (ref 36.0–46.0)
Hemoglobin: 10 g/dL — ABNORMAL LOW (ref 12.0–15.0)
MCH: 31.9 pg (ref 26.0–34.0)
MCHC: 34.6 g/dL (ref 30.0–36.0)
MCV: 92.3 fL (ref 78.0–100.0)
Platelets: 104 10*3/uL — ABNORMAL LOW (ref 150–400)
RBC: 3.13 MIL/uL — ABNORMAL LOW (ref 3.87–5.11)
RDW: 14.2 % (ref 11.5–15.5)
WBC: 9.9 10*3/uL (ref 4.0–10.5)

## 2014-06-17 LAB — PROTIME-INR
INR: 3.04 — AB (ref 0.00–1.49)
Prothrombin Time: 31.5 seconds — ABNORMAL HIGH (ref 11.6–15.2)

## 2014-06-17 LAB — COMPREHENSIVE METABOLIC PANEL
ALBUMIN: 2.5 g/dL — AB (ref 3.5–5.2)
ALK PHOS: 83 U/L (ref 39–117)
ALT: 44 U/L — ABNORMAL HIGH (ref 0–35)
ANION GAP: 9 (ref 5–15)
AST: 29 U/L (ref 0–37)
BUN: 18 mg/dL (ref 6–23)
CO2: 20 mEq/L (ref 19–32)
CREATININE: 0.89 mg/dL (ref 0.50–1.10)
Calcium: 7.8 mg/dL — ABNORMAL LOW (ref 8.4–10.5)
Chloride: 103 mEq/L (ref 96–112)
GFR calc Af Amer: 83 mL/min — ABNORMAL LOW (ref >90)
GFR calc non Af Amer: 72 mL/min — ABNORMAL LOW (ref >90)
Glucose, Bld: 103 mg/dL — ABNORMAL HIGH (ref 70–99)
POTASSIUM: 3.8 meq/L (ref 3.7–5.3)
Sodium: 132 mEq/L — ABNORMAL LOW (ref 137–147)
Total Bilirubin: 0.4 mg/dL (ref 0.3–1.2)
Total Protein: 5.4 g/dL — ABNORMAL LOW (ref 6.0–8.3)

## 2014-06-17 LAB — STREP PNEUMONIAE URINARY ANTIGEN: Strep Pneumo Urinary Antigen: NEGATIVE

## 2014-06-17 MED ORDER — VITAMIN B-1 100 MG PO TABS
100.0000 mg | ORAL_TABLET | Freq: Every day | ORAL | Status: DC
  Administered 2014-06-17 – 2014-06-18 (×2): 100 mg via ORAL
  Filled 2014-06-17 (×2): qty 1

## 2014-06-17 MED ORDER — DEXTROSE 5 % IV SOLN
1.0000 g | Freq: Three times a day (TID) | INTRAVENOUS | Status: DC
  Administered 2014-06-17 – 2014-06-18 (×3): 1 g via INTRAVENOUS
  Filled 2014-06-17 (×4): qty 1

## 2014-06-17 MED ORDER — THIAMINE HCL 100 MG/ML IJ SOLN: 100.0000 mg | Freq: Every day | INTRAMUSCULAR | Status: DC

## 2014-06-17 MED ORDER — LORAZEPAM 2 MG/ML IJ SOLN
0.0000 mg | Freq: Four times a day (QID) | INTRAMUSCULAR | Status: DC
Start: 2014-06-17 — End: 2014-06-18
  Administered 2014-06-18: 2 mg via INTRAVENOUS

## 2014-06-17 MED ORDER — LORAZEPAM 1 MG PO TABS
1.0000 mg | ORAL_TABLET | Freq: Four times a day (QID) | ORAL | Status: DC | PRN
Start: 2014-06-17 — End: 2014-06-18

## 2014-06-17 MED ORDER — LORAZEPAM 2 MG/ML IJ SOLN
1.0000 mg | Freq: Four times a day (QID) | INTRAMUSCULAR | Status: DC | PRN
  Filled 2014-06-17: qty 1

## 2014-06-17 MED ORDER — WARFARIN SODIUM 2 MG PO TABS
4.0000 mg | ORAL_TABLET | Freq: Once | ORAL | Status: AC
  Administered 2014-06-17: 4 mg via ORAL
  Filled 2014-06-17: qty 2

## 2014-06-17 MED ORDER — FAMOTIDINE 20 MG PO TABS
40.0000 mg | ORAL_TABLET | Freq: Every day | ORAL | Status: DC
  Administered 2014-06-17 – 2014-06-18 (×2): 40 mg via ORAL
  Filled 2014-06-17 (×2): qty 2

## 2014-06-17 MED ORDER — KETOROLAC TROMETHAMINE 30 MG/ML IJ SOLN
30.0000 mg | Freq: Once | INTRAMUSCULAR | Status: AC
  Administered 2014-06-17: 30 mg via INTRAVENOUS
  Filled 2014-06-17: qty 1

## 2014-06-17 MED ORDER — FOLIC ACID 1 MG PO TABS
1.0000 mg | ORAL_TABLET | Freq: Every day | ORAL | Status: DC
  Administered 2014-06-17 – 2014-06-18 (×2): 1 mg via ORAL
  Filled 2014-06-17 (×3): qty 1

## 2014-06-17 MED ORDER — LORAZEPAM 2 MG/ML IJ SOLN: 0.0000 mg | Freq: Two times a day (BID) | INTRAMUSCULAR | Status: DC

## 2014-06-17 MED ORDER — WARFARIN - PHARMACIST DOSING INPATIENT: Status: DC

## 2014-06-17 MED ORDER — TRAMADOL HCL 50 MG PO TABS
50.0000 mg | ORAL_TABLET | Freq: Four times a day (QID) | ORAL | Status: DC | PRN
  Administered 2014-06-17 – 2014-06-18 (×2): 50 mg via ORAL
  Filled 2014-06-17 (×2): qty 1

## 2014-06-17 MED ORDER — VANCOMYCIN HCL IN DEXTROSE 750-5 MG/150ML-% IV SOLN
750.0000 mg | Freq: Two times a day (BID) | INTRAVENOUS | Status: DC
  Administered 2014-06-17 (×2): 750 mg via INTRAVENOUS
  Filled 2014-06-17 (×3): qty 150

## 2014-06-17 MED ORDER — METHYLPREDNISOLONE SODIUM SUCC 125 MG IJ SOLR
60.0000 mg | Freq: Four times a day (QID) | INTRAMUSCULAR | Status: DC
  Administered 2014-06-17 – 2014-06-18 (×4): 60 mg via INTRAVENOUS
  Filled 2014-06-17 (×5): qty 2

## 2014-06-17 MED ORDER — ADULT MULTIVITAMIN W/MINERALS CH
1.0000 | ORAL_TABLET | Freq: Every day | ORAL | Status: DC
  Administered 2014-06-17 – 2014-06-18 (×2): 1 via ORAL
  Filled 2014-06-17 (×2): qty 1

## 2014-06-17 NOTE — Progress Notes (Signed)
ANTIBIOTIC CONSULT NOTE -  Pharmacy Consult for Cefepime & Vancomycin Indication: pneumonia  Allergies  Allergen Reactions  . Ivp Dye [Iodinated Diagnostic Agents] Other (See Comments)    Skin is red    Patient Measurements: Height: 5\' 5"  (165.1 cm) Weight: 132 lb (59.875 kg) IBW/kg (Calculated) : 57 Adjusted Body Weight:   Vital Signs: Temp: 98.5 F (36.9 C) (08/09 0400) Temp src: Oral (08/09 0400) BP: 79/54 mmHg (08/09 0630) Pulse Rate: 79 (08/09 0630) Intake/Output from previous day: 08/08 0701 - 08/09 0700 In: 2600 [P.O.:800; I.V.:1500; IV Piggyback:300] Out: 463 [Urine:463] Intake/Output from this shift:    Labs:  Recent Labs  06/16/14 0928 06/17/14 0452  WBC 15.0* 9.9  HGB 13.0 10.0*  PLT 135* 104*  CREATININE 1.82* 0.89   Estimated Creatinine Clearance: 64.3 ml/min (by C-G formula based on Cr of 0.89). No results found for this basename: VANCOTROUGH, Leodis Binet, VANCORANDOM, GENTTROUGH, GENTPEAK, GENTRANDOM, TOBRATROUGH, TOBRAPEAK, TOBRARND, AMIKACINPEAK, AMIKACINTROU, AMIKACIN,  in the last 72 hours   Microbiology: Recent Results (from the past 720 hour(s))  CULTURE, BLOOD (ROUTINE X 2)     Status: None   Collection Time    06/16/14  1:53 PM      Result Value Ref Range Status   Specimen Description BLOOD LEFT HAND   Final   Special Requests BOTTLES DRAWN AEROBIC ONLY 6CC   Final   Culture NO GROWTH 1 DAY   Final   Report Status PENDING   Incomplete  CULTURE, BLOOD (ROUTINE X 2)     Status: None   Collection Time    06/16/14  1:54 PM      Result Value Ref Range Status   Specimen Description RIGHT ANTECUBITAL   Final   Special Requests BOTTLES DRAWN AEROBIC ONLY 8CC   Final   Culture NO GROWTH 1 DAY   Final   Report Status PENDING   Incomplete  MRSA PCR SCREENING     Status: None   Collection Time    06/16/14  4:20 PM      Result Value Ref Range Status   MRSA by PCR NEGATIVE  NEGATIVE Final   Comment:            The GeneXpert MRSA Assay (FDA     approved for NASAL specimens     only), is one component of a     comprehensive MRSA colonization     surveillance program. It is not     intended to diagnose MRSA     infection nor to guide or     monitor treatment for     MRSA infections.    Medical History: Past Medical History  Diagnosis Date  . Hepatic sclerosis   . Neuropathy   . COPD (chronic obstructive pulmonary disease)   . Asthma   . Thyroid disease     Medications:  Scheduled:  . ceFEPime (MAXIPIME) IV  1 g Intravenous Q8H  . escitalopram  20 mg Oral Daily  . gabapentin  600 mg Oral TID  . heparin  5,000 Units Subcutaneous 3 times per day  . levothyroxine  75 mcg Oral QAC breakfast  . LORazepam  0.5 mg Oral TID  . QUEtiapine  300 mg Oral QHS  . rOPINIRole  0.5 mg Oral QHS  . sodium chloride  3 mL Intravenous Q12H  . vancomycin  750 mg Intravenous Q12H   Assessment: ED patient HCAP, sepsis Renal function improving  Goal of Therapy:  Vancomycin trough level 15-20 mcg/ml  Plan:  Change Vancomycin to  750 mg IV every 12 hours Change Cefepime to 1 GM IV every 8 hours Vancomycin trough at steady state Labs per protocol  Raquel JamesPittman, Abbas Beyene Bennett 06/17/2014,7:34 AM

## 2014-06-17 NOTE — Progress Notes (Signed)
ANTICOAGULATION CONSULT NOTE - Initial Consult  Pharmacy Consult for Coumadin Indication: VTE treatment  Allergies  Allergen Reactions  . Ivp Dye [Iodinated Diagnostic Agents] Other (See Comments)    Skin is red    Patient Measurements: Height: 5\' 5"  (165.1 cm) Weight: 132 lb (59.875 kg) IBW/kg (Calculated) : 57 Heparin Dosing Weight:   Vital Signs: Temp: 98.3 F (36.8 C) (08/09 0800) Temp src: Oral (08/09 0800) BP: 107/67 mmHg (08/09 0830) Pulse Rate: 79 (08/09 0730)  Labs:  Recent Labs  06/16/14 0927 06/16/14 0928 06/17/14 0452 06/17/14 1001  HGB  --  13.0 10.0*  --   HCT  --  37.3 28.9*  --   PLT  --  135* 104*  --   LABPROT 26.8*  --   --  31.5*  INR 2.48*  --   --  3.04*  CREATININE  --  1.82* 0.89  --     Estimated Creatinine Clearance: 64.3 ml/min (by C-G formula based on Cr of 0.89).   Medical History: Past Medical History  Diagnosis Date  . Hepatic sclerosis   . Neuropathy   . COPD (chronic obstructive pulmonary disease)   . Asthma   . Thyroid disease     Medications:  Scheduled:  . ceFEPime (MAXIPIME) IV  1 g Intravenous Q8H  . escitalopram  20 mg Oral Daily  . famotidine  40 mg Oral Daily  . gabapentin  600 mg Oral TID  . levothyroxine  75 mcg Oral QAC breakfast  . LORazepam  0.5 mg Oral TID  . methylPREDNISolone (SOLU-MEDROL) injection  60 mg Intravenous Q6H  . QUEtiapine  300 mg Oral QHS  . rOPINIRole  0.5 mg Oral QHS  . sodium chloride  3 mL Intravenous Q12H  . vancomycin  750 mg Intravenous Q12H    Assessment: Nursing home patient, admitted pneumonia Pharmacy consult for Coumadin for VTE treatment Per MD consult, unclear why patient on Coumadin, patient claims portal vein thrombus but ruled out 8/7, continue Coumadin until MD determines need INR increased from 2.48 (06/16/14) to 3.04 (today), no indication warfarin given yesterday. PTA Warfarin 8 mg po daily  Goal of Therapy:  INR 2-3 Monitor platelets by anticoagulation  protocol: Yes   Plan:  Coumadin 4 mg today, reduced due to INR results INR/PT daily   Raquel JamesPittman, Erven Ramson Bennett 06/17/2014,10:53 AM

## 2014-06-17 NOTE — Progress Notes (Signed)
[facility] PROGRESS NOTE  Bridget OberDonna Carroll WUJ:[id number] DOB: 07/16/1956 DOA: 06/16/2014 PCP: No primary provider on file.  Assessment/Plan: Septic shock secondary to HCAP  - Leukocytosis resolved, although pt tachycardic with Tm of 102.6 overnight - CXR with L sided opacity - On vanc and cefepime - Cont with aggressive fluid resuscitation - Pan cultures pending  Leukocytosis  - Per above, likely related to sepsis - Resolved  COPD  - No active wheezing on exam   DVT Prophylaxis  - Heparin on admit - Was on coumadin s/p recent orthopedic surgery  Code Status: Full Family Communication: Discussed with pt's sister (POA) over phone Disposition Plan: Pending   Consultants:    Procedures:    Antibiotics:  Vanc 8/8>>>  Cefepime 8/8>>>   HPI/Subjective: No acute events noted overnight  Objective: Filed Vitals:   06/17/14 0630 06/17/14 0700 06/17/14 0730 06/17/14 0800  BP: 79/54 78/49 90/54  95/53  Pulse: 79 81 79   Temp:    98.3 F (36.8 C)  TempSrc:    Oral  Resp: 20 15 14 17   Height:      Weight:      SpO2: 96% 98% 98%     Intake/Output Summary (Last 24 hours) at 06/17/14 0814 Last data filed at 06/17/14 0758  Gross per 24 hour  Intake   2600 ml  Output    763 ml  Net   1837 ml   Filed Weights   06/16/14 0829  Weight: 59.875 kg (132 lb)    Exam:   General:  Awake, in nad  Cardiovascular: Regular, s1, s2  Respiratory: normal resp effort, no wheezing  Abdomen: soft, nondistended  Musculoskeletal: perfused, no clubbing   Data Reviewed: Basic Metabolic Panel:  Recent Labs Lab 06/16/14 0928 06/17/14 0452  NA 132* 132*  K 4.6 3.8  CL 98 103  CO2 20 20  GLUCOSE 127* 103*  BUN 19 18  CREATININE 1.82* 0.89  CALCIUM 8.5 7.8*   Liver Function Tests:  Recent Labs Lab 06/16/14 0928 06/17/14 0452  AST 75* 29  ALT 71* 44*  ALKPHOS 116 83  BILITOT 0.7 0.4  PROT 6.6 5.4*  ALBUMIN 3.3* 2.5*   No results found for this  basename: LIPASE, AMYLASE,  in the last 168 hours No results found for this basename: AMMONIA,  in the last 168 hours CBC:  Recent Labs Lab 06/16/14 0928 06/17/14 0452  WBC 15.0* 9.9  NEUTROABS 14.3*  --   HGB 13.0 10.0*  HCT 37.3 28.9*  MCV 91.9 92.3  PLT 135* 104*   Cardiac Enzymes: No results found for this basename: CKTOTAL, CKMB, CKMBINDEX, TROPONINI,  in the last 168 hours BNP (last 3 results) No results found for this basename: PROBNP,  in the last 8760 hours CBG: No results found for this basename: GLUCAP,  in the last 168 hours  Recent Results (from the past 240 hour(s))  CULTURE, BLOOD (ROUTINE X 2)     Status: None   Collection Time    06/16/14  1:53 PM      Result Value Ref Range Status   Specimen Description BLOOD LEFT HAND   Final   Special Requests BOTTLES DRAWN AEROBIC ONLY 6CC   Final   Culture NO GROWTH 1 DAY   Final   Report Status PENDING   Incomplete  CULTURE, BLOOD (ROUTINE X 2)     Status: None   Collection Time    06/16/14  1:54 PM      Result Value  Ref Range Status   Specimen Description RIGHT ANTECUBITAL   Final   Special Requests BOTTLES DRAWN AEROBIC ONLY 8CC   Final   Culture NO GROWTH 1 DAY   Final   Report Status PENDING   Incomplete  MRSA PCR SCREENING     Status: None   Collection Time    06/16/14  4:20 PM      Result Value Ref Range Status   MRSA by PCR NEGATIVE  NEGATIVE Final   Comment:            The GeneXpert MRSA Assay (FDA     approved for NASAL specimens     only), is one component of a     comprehensive MRSA colonization     surveillance program. It is not     intended to diagnose MRSA     infection nor to guide or     monitor treatment for     MRSA infections.     Studies: Dg Chest 2 View  06/16/2014   CLINICAL DATA:  Low oxygen saturations.  EXAM: CHEST  2 VIEW  COMPARISON:  04/06/2014  FINDINGS: The heart is mildly enlarged but stable. The mediastinal and hilar contours are within normal limits and unchanged. Patchy  airspace opacity in the left lower lobe may suggest pneumonia. No pulmonary edema or pleural effusion.  IMPRESSION: Suspect left lower lobe infiltrate.  No edema or effusions.   Electronically Signed   By: Loralie Champagne M.D.   On: 06/16/2014 11:40   Ct Abdomen W Contrast  06/15/2014   CLINICAL DATA:  Evaluate portal vein thrombosis.  EXAM: CT ABDOMEN WITH CONTRAST  TECHNIQUE: Multidetector CT imaging of the abdomen was performed using the standard protocol following bolus administration of intravenous contrast.  CONTRAST:  OMNIPAQUE IOHEXOL 300 MG/ML  SOLN  COMPARISON:  None  FINDINGS: No pleural effusion identified.  No pericardial effusion.  The lung bases appear clear. Moderate to advanced changes of cirrhosis. No focal liver lesions identified. The portal vein is patent and appears increased in diameter. Left upper quadrant varices noted. Gallbladder appears normal. No biliary dilatation. Normal appearance of the pancreas. The spleen measures 9 cm in length. The adrenal glands are both normal. The left kidney is normal. Cyst within the inferior pole the left kidney are noted measuring up to 1.3 cm. No retroperitoneal adenopathy identified. No ascites. The bowel loops within the upper abdomen appear normal.  Review of the visualized osseous structures is significant for healed fractures involving left eleventh and twelfth posterior ribs.  IMPRESSION: 1. No acute findings within the abdomen. 2. Morphologic features of the liver compatible with cirrhosis with evidence of portal venous hypertension 3. No evidence for portal vein thrombosis.   Electronically Signed   By: Signa Kell M.D.   On: 06/15/2014 15:43   Dg Knee Complete 4 Views Right  06/16/2014   CLINICAL DATA:  Fall with right knee pain.  EXAM: RIGHT KNEE - COMPLETE 4+ VIEW  COMPARISON:  None.  FINDINGS: No acute fracture, dislocation or joint effusion is identified. There is mild medial joint space narrowing. No bony lesions.  IMPRESSION:  No acute fracture.   Electronically Signed   By: Irish Lack M.D.   On: 06/16/2014 16:18    Scheduled Meds: . ceFEPime (MAXIPIME) IV  1 g Intravenous Q8H  . escitalopram  20 mg Oral Daily  . gabapentin  600 mg Oral TID  . heparin  5,000 Units Subcutaneous 3 times per day  .  levothyroxine  75 mcg Oral QAC breakfast  . LORazepam  0.5 mg Oral TID  . QUEtiapine  300 mg Oral QHS  . rOPINIRole  0.5 mg Oral QHS  . sodium chloride  3 mL Intravenous Q12H  . vancomycin  750 mg Intravenous Q12H   Continuous Infusions: . sodium chloride 100 mL/hr at 06/17/14 0600    Principal Problem:   Septic shock Active Problems:   HCAP (healthcare-associated pneumonia)   Leukocytosis  Time spent:  CHIU, STEPHEN K  [facility] Pager [phone]. If 7PM-7AM, please contact night-coverage at [email or web address], password [facility] 06/17/2014, 8:14 AM  LOS: 1 day

## 2014-06-17 NOTE — ED Provider Notes (Signed)
Medical screening examination/treatment/procedure(s) were conducted as a shared visit with non-physician practitioner(s) and myself.  I personally evaluated the patient during the encounter.   EKG Interpretation   Date/Time:  Saturday June 16 2014 08:34:31 EDT Ventricular Rate:  80 PR Interval:  167 QRS Duration: 131 QT Interval:  455 QTC Calculation: 525 R Axis:   48 Text Interpretation:  Sinus rhythm Probable left atrial enlargement IVCD,  consider atypical LBBB Confirmed by Corban Kistler  MD, Nazanin Kinner ([id number]) on 06/16/2014  8:49:47 AM     Left lower lobe pneumonia noted. IV hydration. IV vancomycin and Maxipime ordered. Admit to general medicine  Donnetta HutchingBrian Cambrie Sonnenfeld, MD 06/17/14 [phone]

## 2014-06-17 NOTE — Progress Notes (Signed)
Report called to L.Foote,LPN. Patient transferred to 337 in stable condition with NT via wheelchair.

## 2014-06-18 DIAGNOSIS — Z7901 Long term (current) use of anticoagulants: Secondary | ICD-10-CM

## 2014-06-18 LAB — CBC
HEMATOCRIT: 30.6 % — AB (ref 36.0–46.0)
Hemoglobin: 10.5 g/dL — ABNORMAL LOW (ref 12.0–15.0)
MCH: 31.6 pg (ref 26.0–34.0)
MCHC: 34.3 g/dL (ref 30.0–36.0)
MCV: 92.2 fL (ref 78.0–100.0)
Platelets: 103 10*3/uL — ABNORMAL LOW (ref 150–400)
RBC: 3.32 MIL/uL — ABNORMAL LOW (ref 3.87–5.11)
RDW: 13.1 % (ref 11.5–15.5)
WBC: 8.6 10*3/uL (ref 4.0–10.5)

## 2014-06-18 LAB — LEGIONELLA ANTIGEN, URINE: Legionella Antigen, Urine: NEGATIVE

## 2014-06-18 LAB — PROTIME-INR
INR: 2.76 — ABNORMAL HIGH (ref 0.00–1.49)
Prothrombin Time: 29.2 seconds — ABNORMAL HIGH (ref 11.6–15.2)

## 2014-06-18 LAB — BASIC METABOLIC PANEL
Anion gap: 13 (ref 5–15)
BUN: 14 mg/dL (ref 6–23)
CO2: 22 mEq/L (ref 19–32)
CREATININE: 0.66 mg/dL (ref 0.50–1.10)
Calcium: 9.4 mg/dL (ref 8.4–10.5)
Chloride: 103 mEq/L (ref 96–112)
GFR calc Af Amer: >90 mL/min (ref >90)
Glucose, Bld: 152 mg/dL — ABNORMAL HIGH (ref 70–99)
Potassium: 3.8 mEq/L (ref 3.7–5.3)
Sodium: 138 mEq/L (ref 137–147)

## 2014-06-18 MED ORDER — WARFARIN SODIUM 2 MG PO TABS: 4.0000 mg | ORAL_TABLET | Freq: Once | ORAL | Status: DC

## 2014-06-18 MED ORDER — LEVOFLOXACIN IN D5W 500 MG/100ML IV SOLN
500.0000 mg | INTRAVENOUS | Status: DC
  Filled 2014-06-18: qty 100

## 2014-06-18 MED ORDER — LEVOFLOXACIN 750 MG PO TABS
750.0000 mg | ORAL_TABLET | Freq: Every day | ORAL | Status: DC
  Administered 2014-06-18: 750 mg via ORAL
  Filled 2014-06-18: qty 1

## 2014-06-18 MED ORDER — LEVOFLOXACIN 750 MG PO TABS: 750.0000 mg | ORAL_TABLET | Freq: Every day | ORAL | Status: DC

## 2014-06-18 MED ORDER — PREDNISONE 5 MG PO TABS: 5.0000 mg | ORAL_TABLET | Freq: Every day | ORAL | Status: DC

## 2014-06-18 MED ORDER — PREDNISONE 5 MG PO TABS: 5.0000 mg | ORAL_TABLET | Freq: Every day | ORAL | Status: AC

## 2014-06-18 MED ORDER — METHYLPREDNISOLONE SODIUM SUCC 125 MG IJ SOLR: 60.0000 mg | Freq: Every day | INTRAMUSCULAR | Status: DC

## 2014-06-18 MED ORDER — LEVOFLOXACIN 750 MG PO TABS: 750.0000 mg | ORAL_TABLET | Freq: Every day | ORAL | Status: AC

## 2014-06-18 MED FILL — Medication: Qty: 1 | Status: AC

## 2014-06-18 NOTE — Evaluation (Signed)
Occupational Therapy Evaluation Patient Details Name: Gwenyth OberDonna Wendorf MRN: [id number] DOB: 02/04/1956 Today's Date: 06/18/2014    History of Present Illness Gwenyth OberDonna Tay is a 58 y.o. female who presents to the ED via EMS after a fall. She states she stood up and felt dizzy. She reached for her walker and fell. She doesn't remember what she hit when she fell. She states she doesn't remember much after that. PMH significant for hip fracture last month and is in assisted living.   Clinical Impression   Patient participated in OT evaluation. Patient reports that she doesn't feel any weakness and she is at baseline. Patient completes ADL independently. 4/5 strength in BUE. AROM WNL.     Follow Up Recommendations  No OT follow up          Precautions / Restrictions Precautions Precautions: Fall Restrictions Weight Bearing Restrictions: No      Mobility Bed Mobility Overal bed mobility: Independent                Transfers Overall transfer level: Modified independent Equipment used: Rolling walker (2 wheeled)                  Balance Overall balance assessment: History of Falls;Needs assistance Sitting-balance support: No upper extremity supported;Feet supported Sitting balance-Leahy Scale: Good     Standing balance support: Bilateral upper extremity supported;During functional activity Standing balance-Leahy Scale: Fair                                              Pertinent Vitals/Pain Pain Assessment: No/denies pain     Hand Dominance Right   Extremity/Trunk Assessment Upper Extremity Assessment Upper Extremity Assessment: Overall WFL for tasks assessed   Lower Extremity Assessment Lower Extremity Assessment: Defer to PT evaluation       Communication Communication Communication: No difficulties   Cognition Arousal/Alertness: Awake/alert Behavior During Therapy: WFL for tasks assessed/performed Overall Cognitive Status: No  family/caregiver present to determine baseline cognitive functioning                                Home Living Family/patient expects to be discharged to:: Assisted living                             Home Equipment: Walker - standard;Grab bars - tub/shower;Grab bars - toilet;Shower seat          Prior Functioning/Environment Level of Independence: Independent with assistive device(s)                      OT Goals(Current goals can be found in the care plan section) Acute Rehab OT Goals Patient Stated Goal: go home  OT Frequency:             Co-evaluation PT/OT/SLP Co-Evaluation/Treatment: Yes Reason for Co-Treatment: For patient/therapist safety PT goals addressed during session: Mobility/safety with mobility;Balance;Proper use of DME;Strengthening/ROM OT goals addressed during session: Strengthening/ROM;ADL's and self-care      End of Session Equipment Utilized During Treatment: Gait belt;Rolling walker  Activity Tolerance: Patient tolerated treatment well Patient left: in bed;with call bell/phone within reach;with bed alarm set   Time: [id number] OT Time Calculation (min): 16 min Charges:  OT General Charges $OT Visit: 1 Procedure OT Evaluation $Initial  OT Evaluation Tier I: 1 Procedure  Limmie Patricia, OTR/L,CBIS   06/18/2014, 10:32 AM

## 2014-06-18 NOTE — Progress Notes (Signed)
ANTICOAGULATION CONSULT NOTE - follow up  Pharmacy Consult for Coumadin Indication: VTE treatment  Allergies  Allergen Reactions  . Ivp Dye [Iodinated Diagnostic Agents] Other (See Comments)    Skin is red   Patient Measurements: Height: 5\' 5"  (165.1 cm) Weight: 132 lb (59.875 kg) IBW/kg (Calculated) : 57  Vital Signs: Temp: 98 F (36.7 C) (08/10 0452) Temp src: Oral (08/10 0452) BP: 152/67 mmHg (08/10 0452) Pulse Rate: 55 (08/10 0452)  Labs:  Recent Labs  06/16/14 0927  06/16/14 0928 06/17/14 0452 06/17/14 1001 06/18/14 0541  HGB  --   < > 13.0 10.0*  --  10.5*  HCT  --   --  37.3 28.9*  --  30.6*  PLT  --   --  135* 104*  --  103*  LABPROT 26.8*  --   --   --  31.5* 29.2*  INR 2.48*  --   --   --  3.04* 2.76*  CREATININE  --   --  1.82* 0.89  --  0.66  < > = values in this interval not displayed.  Medical History: Past Medical History  Diagnosis Date  . Hepatic sclerosis   . Neuropathy   . COPD (chronic obstructive pulmonary disease)   . Asthma   . Thyroid disease    Medications:  Scheduled:  . escitalopram  20 mg Oral Daily  . famotidine  40 mg Oral Daily  . folic acid  1 mg Oral Daily  . gabapentin  600 mg Oral TID  . levofloxacin  750 mg Oral Daily  . levothyroxine  75 mcg Oral QAC breakfast  . LORazepam  0-4 mg Intravenous Q6H   Followed by  . [START ON 06/19/2014] LORazepam  0-4 mg Intravenous Q12H  . LORazepam  0.5 mg Oral TID  . [START ON 06/19/2014] methylPREDNISolone (SOLU-MEDROL) injection  60 mg Intravenous Daily  . multivitamin with minerals  1 tablet Oral Daily  . QUEtiapine  300 mg Oral QHS  . rOPINIRole  0.5 mg Oral QHS  . sodium chloride  3 mL Intravenous Q12H  . thiamine  100 mg Oral Daily   Or  . thiamine  100 mg Intravenous Daily  . vancomycin  750 mg Intravenous Q12H  . Warfarin - Pharmacist Dosing Inpatient   Does not apply Q24H   Assessment: Nursing home patient on chronic Coumadin, admitted with pneumonia.  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  PTA Warfarin dose reportedly is  8 mg po daily.  INR is now therapeutic.    Goal of Therapy:  INR 2-3 Monitor platelets by anticoagulation protocol: Yes   Plan:   Repeat Coumadin 4 mg today x 1   INR/PT daily  F/U plans for duration of Coumadin Rx  Margo AyeHall, Markelle Asaro A 06/18/2014,8:36 AM

## 2014-06-18 NOTE — Progress Notes (Signed)
NURSING PROGRESS NOTE  Gwenyth OberDonna Hoaglin [id number] Discharge Data: 06/18/2014 7:42 PM Attending Provider: No att. providers found PCP:No primary provider on file.   Gwenyth Oberonna Broda to be D/C'd Nursing Home per MD order.    All IV's will be discontinued and monitored for bleeding.  All belongings will be returned to patient for patient to take home.  Patient left unit with transporter from facility via ambulation.  Last Documented Vital Signs:  Blood pressure 153/72, pulse 59, temperature 98.7 F (37.1 C), temperature source Oral, resp. rate 20, height 5\' 5"  (1.651 m), weight 59.875 kg (132 lb), SpO2 97.00%.  Mertha BaarsHorine, Blanca Carreon D

## 2014-06-18 NOTE — Evaluation (Signed)
Physical Therapy Evaluation Patient Details Name: [name] MRN: [id number] DOB: Nov 06, 1956 Today's Date: 06/18/2014   History of Present Illness  [name] is a 58 y.o. female who presents to the ED via EMS after a fall. She states she stood up and felt dizzy. She reached for her walker and fell. She doesn't remember what she hit when she fell. She states she doesn't remember much after that. PMH significant for hip fracture last month and is in assisted living.  Clinical Impression  Pt is a 57 year old female who presents to physical therapy with dx of septic shock.  Pt has been living in an ALF since July, and has a hx of Rt hip fracture and multiple falls.  During evaluation, pt was (I) with bed mobility skills, mod (I) with transfers, and mod (I) with use of RW for gait of 450 feet.  Noted decreased step length, and antalgic gait pattern on the Rt LE.  Pt is at baseline for acute PT setting, though pt would benefit from HHPT services to address strengthening, balance for improved functional mobility skills.  No DME recommendations.     Follow Up Recommendations Home health PT    Equipment Recommendations  None recommended by PT       Precautions / Restrictions Precautions Precautions: Fall Restrictions Weight Bearing Restrictions: No      Mobility  Bed Mobility Overal bed mobility: Independent                Transfers Overall transfer level: Modified independent Equipment used: Rolling walker (2 wheeled)                Ambulation/Gait Ambulation/Gait assistance: Modified independent (Device/Increase time) Ambulation Distance (Feet): 400 Feet Assistive device: Rolling walker (2 wheeled) Gait Pattern/deviations: Step-through pattern;Decreased step length - right Gait velocity: Antalgia on the Rt LE Gait velocity interpretation: Below normal speed for age/gender       Balance Overall balance assessment: History of Falls;Needs  assistance Sitting-balance support: No upper extremity supported;Feet supported Sitting balance-Leahy Scale: Good     Standing balance support: Bilateral upper extremity supported;During functional activity Standing balance-Leahy Scale: Fair                               Pertinent Vitals/Pain Pain Assessment: No/denies pain    Home Living Family/patient expects to be discharged to:: Assisted living               Home Equipment: Walker - standard;Grab bars - tub/shower;Grab bars - toilet;Shower seat      Prior Function Level of Independence: Independent with assistive device(s)               Hand Dominance   Dominant Hand: Right    Extremity/Trunk Assessment   Upper Extremity Assessment: Defer to OT evaluation           Lower Extremity Assessment: Generalized weakness         Communication   Communication: No difficulties  Cognition Arousal/Alertness: Awake/alert Behavior During Therapy: WFL for tasks assessed/performed Overall Cognitive Status: No family/caregiver present to determine baseline cognitive functioning                               Assessment/Plan    PT Assessment All further PT needs can be met in the next venue of care  PT Diagnosis Difficulty walking;Generalized weakness  PT Problem List Decreased strength;Decreased activity tolerance;Decreased balance;Decreased mobility;Decreased safety awareness  PT Treatment Interventions Balance training;DME instruction;Gait training;Functional mobility training;Therapeutic activities;Therapeutic exercise;Patient/family education;Neuromuscular re-education   PT Goals (Current goals can be found in the Care Plan section) Acute Rehab PT Goals Patient Stated Goal: go home Potential to Achieve Goals: Good; None set as pt to be d/c from PT services    Frequency Other (Comment) (None, pt to be discharged. )        Co-evaluation   Reason for Co-Treatment: For  patient/therapist safety PT goals addressed during session: Mobility/safety with mobility;Balance;Proper use of DME;Strengthening/ROM OT goals addressed during session: Strengthening/ROM;ADL's and self-care       End of Session Equipment Utilized During Treatment: Gait belt Activity Tolerance: Patient tolerated treatment well Patient left: in bed;with bed alarm set;with call bell/phone within reach           Time: 0634-9494 PT Time Calculation (min): 16 min   Charges:   PT Evaluation $Initial PT Evaluation Tier I: 1 Procedure      Dyani Babel 06/18/2014, 10:01 AM

## 2014-06-18 NOTE — Care Management Note (Signed)
    Page 1 of 1   06/18/2014     2:44:07 PM CARE MANAGEMENT NOTE 06/18/2014  Patient:  [name]   Account Number:  [id number]  Date Initiated:  06/18/2014  Documentation initiated by:  Anibal HendersonBOLDEN,Katonya Blecher  Subjective/Objective Assessment:   Admitted with sepsis and PNA, from ALF. CSW aware and is working on D/C back to facility. [facility] due [facility]x     Action/Plan:   [facility] and will resume HH in 1-2 days. Information also faxed to [facility]   Anticipated DC Date:  06/18/2014   Anticipated DC Plan:  HOME W HOME HEALTH SERVICES  In-house referral  Clinical Social Worker      DC Planning Services  CM consult      [facility]   Choice offered to / List presented to:  C-1 Patient        HH arranged  HH-2 PT      [facility]  [facility]   Status of service:  Completed, signed off Medicare Important Message given?   (If response is "NO", the following Medicare IM given date fields will be blank) Date Medicare IM given:   Medicare IM given by:   Date Additional Medicare IM given:   Additional Medicare IM given by:    Discharge Disposition:  HOME W HOME HEALTH SERVICES  Per UR Regulation:  Reviewed for med. necessity/level of care/duration of stay  If discussed at Long Length of Stay Meetings, dates discussed:    Comments:  06/18/14 1400 Anibal HendersonGeneva Judythe Postema RN/CM

## 2014-06-18 NOTE — Progress Notes (Signed)
ANTIBIOTIC CONSULT NOTE - follow up  Pharmacy Consult for Vancomycin (also on Levaquin) Renal adjustment Protocol per Pharmacy  Indication: pneumonia  Allergies  Allergen Reactions  . Ivp Dye [Iodinated Diagnostic Agents] Other (See Comments)    Skin is red   Patient Measurements: Height: 5\' 5"  (165.1 cm) Weight: 132 lb (59.875 kg) IBW/kg (Calculated) : 57  Vital Signs: Temp: 98 F (36.7 C) (08/10 0452) Temp src: Oral (08/10 0452) BP: 152/67 mmHg (08/10 0452) Pulse Rate: 55 (08/10 0452) Intake/Output from previous day: 08/09 0701 - 08/10 0700 In: 2578.3 [I.V.:2378.3; IV Piggyback:200] Out: 1500 [Urine:1500] Intake/Output from this shift:    Labs:  Recent Labs  06/16/14 0928 06/17/14 0452 06/18/14 0541  WBC 15.0* 9.9 8.6  HGB 13.0 10.0* 10.5*  PLT 135* 104* 103*  CREATININE 1.82* 0.89 0.66   Estimated Creatinine Clearance: 71.5 ml/min (by C-G formula based on Cr of 0.66). No results found for this basename: VANCOTROUGH, Leodis BinetVANCOPEAK, VANCORANDOM, GENTTROUGH, GENTPEAK, GENTRANDOM, TOBRATROUGH, TOBRAPEAK, TOBRARND, AMIKACINPEAK, AMIKACINTROU, AMIKACIN,  in the last 72 hours   Microbiology: Recent Results (from the past 720 hour(s))  CULTURE, BLOOD (ROUTINE X 2)     Status: None   Collection Time    06/16/14  1:53 PM      Result Value Ref Range Status   Specimen Description BLOOD LEFT HAND   Final   Special Requests BOTTLES DRAWN AEROBIC ONLY 6CC   Final   Culture NO GROWTH 1 DAY   Final   Report Status PENDING   Incomplete  CULTURE, BLOOD (ROUTINE X 2)     Status: None   Collection Time    06/16/14  1:54 PM      Result Value Ref Range Status   Specimen Description RIGHT ANTECUBITAL   Final   Special Requests BOTTLES DRAWN AEROBIC ONLY 8CC   Final   Culture NO GROWTH 1 DAY   Final   Report Status PENDING   Incomplete  MRSA PCR SCREENING     Status: None   Collection Time    06/16/14  4:20 PM      Result Value Ref Range Status   MRSA by PCR NEGATIVE   NEGATIVE Final   Comment:            The GeneXpert MRSA Assay (FDA     approved for NASAL specimens     only), is one component of a     comprehensive MRSA colonization     surveillance program. It is not     intended to diagnose MRSA     infection nor to guide or     monitor treatment for     MRSA infections.   Medical History: Past Medical History  Diagnosis Date  . Hepatic sclerosis   . Neuropathy   . COPD (chronic obstructive pulmonary disease)   . Asthma   . Thyroid disease    Medications:  Scheduled:  . escitalopram  20 mg Oral Daily  . famotidine  40 mg Oral Daily  . folic acid  1 mg Oral Daily  . gabapentin  600 mg Oral TID  . levofloxacin  750 mg Oral Daily  . levothyroxine  75 mcg Oral QAC breakfast  . LORazepam  0-4 mg Intravenous Q6H   Followed by  . [START ON 06/19/2014] LORazepam  0-4 mg Intravenous Q12H  . LORazepam  0.5 mg Oral TID  . [START ON 06/19/2014] methylPREDNISolone (SOLU-MEDROL) injection  60 mg Intravenous Daily  . multivitamin with minerals  1 tablet Oral Daily  . QUEtiapine  300 mg Oral QHS  . rOPINIRole  0.5 mg Oral QHS  . sodium chloride  3 mL Intravenous Q12H  . thiamine  100 mg Oral Daily   Or  . thiamine  100 mg Intravenous Daily  . vancomycin  750 mg Intravenous Q12H  . Warfarin - Pharmacist Dosing Inpatient   Does not apply Q24H   Assessment: 58yo female admitted with suspected pneumonia.  Pt currently on Vancomycin and Levaquin.  WBC improved.  SCr improved.  Pt is afebrile.  Estimated Creatinine Clearance: 71.5 ml/min (by C-G formula based on Cr of 0.66).  Goal of Therapy:  Vancomycin trough level 15-20 mcg/ml  Plan:   Vancomycin to  750 mg IV every 12 hours  Vancomycin trough at steady state  Levaquin 750mg  PO daily  Labs per protocol  PHARMACIST - PHYSICIAN COMMUNICATION DR:   Rhona Leavens CONCERNING: Antibiotic IV to Oral Route Change Policy  RECOMMENDATION: This patient is receiving Levaquin by the intravenous route.   Based on criteria approved by the Pharmacy and Therapeutics Committee, the antibiotic(s) is/are being converted to the equivalent oral dose form(s).  DESCRIPTION: These criteria include:  Patient being treated for a respiratory tract infection, urinary tract infection, cellulitis or clostridium difficile associated diarrhea if on metronidazole  The patient is not neutropenic and does not exhibit a GI malabsorption state  The patient is eating (either orally or via tube) and/or has been taking other orally administered medications for a least 24 hours  The patient is improving clinically and has a Tmax < 100.5  If you have questions about this conversion, please contact the Pharmacy Department  [x]   [phone] )  Damaris Schooner, Einar Nolasco A 06/18/2014,8:30 AM
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
## 2014-06-18 NOTE — Progress Notes (Signed)
UR chart review completed.  
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
## 2014-06-21 LAB — CULTURE, BLOOD (ROUTINE X 2)
Culture: NO GROWTH
Culture: NO GROWTH

## 2015-02-27 ENCOUNTER — Emergency Department (HOSPITAL_COMMUNITY): Payer: Medicaid Other

## 2015-02-27 ENCOUNTER — Emergency Department (HOSPITAL_COMMUNITY)
Admission: EM | Admit: 2015-02-27 | Discharge: 2015-02-27 | Disposition: A | Discharge status: Home / Self Care | Payer: Medicaid Other | Attending: Emergency Medicine | Admitting: Emergency Medicine

## 2015-02-27 ENCOUNTER — Encounter (HOSPITAL_COMMUNITY): Payer: Self-pay

## 2015-02-27 DIAGNOSIS — J449 Chronic obstructive pulmonary disease, unspecified: Secondary | ICD-10-CM | POA: Insufficient documentation

## 2015-02-27 DIAGNOSIS — Z7952 Long term (current) use of systemic steroids: Secondary | ICD-10-CM | POA: Insufficient documentation

## 2015-02-27 DIAGNOSIS — E079 Disorder of thyroid, unspecified: Secondary | ICD-10-CM | POA: Diagnosis not present

## 2015-02-27 DIAGNOSIS — Z720 Tobacco use: Secondary | ICD-10-CM | POA: Insufficient documentation

## 2015-02-27 DIAGNOSIS — Z8719 Personal history of other diseases of the digestive system: Secondary | ICD-10-CM | POA: Diagnosis not present

## 2015-02-27 DIAGNOSIS — Z7901 Long term (current) use of anticoagulants: Secondary | ICD-10-CM | POA: Insufficient documentation

## 2015-02-27 DIAGNOSIS — M79671 Pain in right foot: Secondary | ICD-10-CM | POA: Diagnosis present

## 2015-02-27 DIAGNOSIS — G629 Polyneuropathy, unspecified: Secondary | ICD-10-CM | POA: Insufficient documentation

## 2015-02-27 DIAGNOSIS — Z79899 Other long term (current) drug therapy: Secondary | ICD-10-CM | POA: Diagnosis not present

## 2015-02-27 DIAGNOSIS — Z791 Long term (current) use of non-steroidal anti-inflammatories (NSAID): Secondary | ICD-10-CM | POA: Insufficient documentation

## 2015-02-27 MED ORDER — ACETAMINOPHEN-CODEINE #3 300-30 MG PO TABS
1.0000 | ORAL_TABLET | Freq: Four times a day (QID) | ORAL | Status: AC | PRN
Start: 2015-02-27 — End: ?

## 2015-02-27 NOTE — ED Notes (Signed)
Patient with no complaints at this time. Respirations even and unlabored. Skin warm/dry. Discharge instructions reviewed with patient at this time. Patient given opportunity to voice concerns/ask questions. Patient discharged at this time and left Emergency Department with steady gait.   

## 2015-02-27 NOTE — Discharge Instructions (Signed)
Please soak feet in warm Epsom salt water daily. Use tylenol for mild pain. Use tylenol codeine for more severe pain. Take this medication with food. See Dr Nolen MuMcKinney, podiatry specialist as soon as possible for evaluation.

## 2015-02-27 NOTE — ED Notes (Signed)
[facility]

## 2015-02-27 NOTE — ED Notes (Signed)
Pt reports small black area on r heel that is sore.  Pt says now noticed a knot on left great toe as well.  C/O pain that is worse when pressure applied to heel.

## 2015-02-27 NOTE — ED Provider Notes (Signed)
CSN: [id number]     Arrival date & time 02/27/15  1521 History   First MD Initiated Contact with Patient 02/27/15 1611     Chief Complaint  Patient presents with  . Foot Pain     (Consider location/radiation/quality/duration/timing/severity/associated sxs/prior Treatment) HPI Comments: Pt c/o a small rough texture area of the right hell  That has been sore to palpation for 4 or 5 days. Today she noted a dark spot in the center. She is concerned for infection or gangrene. No fever or chills. No known injury. No puncture wound noted.  Patient is a 59 y.o. female presenting with lower extremity pain. The history is provided by the patient.  Foot Pain The current episode started in the past 7 days. The problem occurs intermittently. The problem has been gradually worsening. Pertinent negatives include no abdominal pain, arthralgias, chest pain, coughing, fever, neck pain or numbness. The symptoms are aggravated by standing and walking. She has tried nothing for the symptoms. The treatment provided no relief.    Past Medical History  Diagnosis Date  . Hepatic sclerosis   . Neuropathy   . COPD (chronic obstructive pulmonary disease)   . Asthma   . Thyroid disease    Past Surgical History  Procedure Laterality Date  . Radical hysterectomy     No family history on file. History  Substance Use Topics  . Smoking status: Current Every Day Smoker -- 0.50 packs/day for 25 years    Types: Cigarettes  . Smokeless tobacco: Not on file  . Alcohol Use: Yes     Comment: occ   OB History    No data available     Review of Systems  Constitutional: Negative for fever and activity change.       All ROS Neg except as noted in HPI  HENT: Negative for nosebleeds.   Eyes: Negative for photophobia and discharge.  Respiratory: Negative for cough, shortness of breath and wheezing.   Cardiovascular: Negative for chest pain and palpitations.  Gastrointestinal: Negative for abdominal pain and blood  in stool.  Genitourinary: Negative for dysuria, frequency and hematuria.  Musculoskeletal: Negative for back pain, arthralgias and neck pain.  Skin: Negative.   Neurological: Negative for dizziness, seizures, speech difficulty and numbness.  Psychiatric/Behavioral: Negative for hallucinations and confusion.      Allergies  Ivp dye  Home Medications   Prior to Admission medications   Medication Sig Start Date End Date Taking? Authorizing Provider  albuterol (PROAIR HFA) 108 (90 BASE) MCG/ACT inhaler Inhale 1-2 puffs into the lungs every 6 (six) hours as needed for wheezing or shortness of breath.    Historical Provider, MD  albuterol (PROVENTIL) (2.5 MG/3ML) 0.083% nebulizer solution Take 2.5 mg by nebulization 4 (four) times daily as needed for wheezing or shortness of breath.    Historical Provider, MD  DiphenhydrAMINE HCl (COMPLETE ALLERGY PO) Take 2 tablets by mouth daily as needed (for allergies).    Historical Provider, MD  docusate sodium (COLACE) 100 MG capsule Take 100 mg by mouth 2 (two) times daily as needed for mild constipation.    Historical Provider, MD  escitalopram (LEXAPRO) 20 MG tablet Take 20 mg by mouth daily.    Historical Provider, MD  gabapentin (NEURONTIN) 600 MG tablet Take 600 mg by mouth 3 (three) times daily.    Historical Provider, MD  levofloxacin (LEVAQUIN) 750 MG tablet Take 1 tablet (750 mg total) by mouth daily. 06/18/14   Jerald Kief, MD  levothyroxine (SYNTHROID,  LEVOTHROID) 75 MCG tablet Take 75 mcg by mouth daily before breakfast.    Historical Provider, MD  LORazepam (ATIVAN) 0.5 MG tablet Take 0.5 mg by mouth 3 (three) times daily.    Historical Provider, MD  magnesium oxide (MAG-OX) 400 MG tablet Take 400 mg by mouth 2 (two) times daily.    Historical Provider, MD  oxyCODONE-acetaminophen (ROXICET) 5-325 MG per tablet Take 1 tablet by mouth every 4 (four) hours as needed for severe pain. 06/05/14   Hope Orlene OchM Neese, NP  predniSONE (DELTASONE) 5 MG  tablet Take 1 tablet (5 mg total) by mouth daily with breakfast. 06/18/14   Jerald KiefStephen K Chiu, MD  QUEtiapine (SEROQUEL) 300 MG tablet Take 300 mg by mouth at bedtime.    Historical Provider, MD  rOPINIRole (REQUIP) 0.5 MG tablet Take 0.5 mg by mouth at bedtime.    Historical Provider, MD  Tiotropium Bromide Monohydrate (SPIRIVA RESPIMAT) 2.5 MCG/ACT AERS Inhale 2 puffs into the lungs daily.    Historical Provider, MD  warfarin (COUMADIN) 4 MG tablet Take 8 mg by mouth daily.    Historical Provider, MD   BP 145/98 mmHg  Pulse 81  Temp(Src) 98.8 F (37.1 C) (Oral)  Resp 16  Ht 5\' 5"  (1.651 m)  Wt 130 lb (58.968 kg)  BMI 21.63 kg/m2  SpO2 100% Physical Exam  Constitutional: She is oriented to person, place, and time. She appears well-developed and well-nourished.  Non-toxic appearance.  HENT:  Head: Normocephalic.  Right Ear: Tympanic membrane and external ear normal.  Left Ear: Tympanic membrane and external ear normal.  Eyes: EOM and lids are normal. Pupils are equal, round, and reactive to light.  Neck: Normal range of motion. Neck supple. Carotid bruit is not present.  Cardiovascular: Normal rate, regular rhythm, normal heart sounds, intact distal pulses and normal pulses.   Pulmonary/Chest: Breath sounds normal. No respiratory distress.  Abdominal: Soft. Bowel sounds are normal. There is no tenderness. There is no guarding.  Musculoskeletal: Normal range of motion.  Callus type area of the right heel with dark spot in the center. Tender to palpation. Not hot. No red streaks. DP and PT 2+ bilat. No lesions between the toes. Achilles intact.  The left first toe is sore, but no lesions or color or temp changes noted.  Lymphadenopathy:       Head (right side): No submandibular adenopathy present.       Head (left side): No submandibular adenopathy present.    She has no cervical adenopathy.  Neurological: She is alert and oriented to person, place, and time. She has normal strength. No  cranial nerve deficit or sensory deficit.  Skin: Skin is warm and dry.  Psychiatric: She has a normal mood and affect. Her speech is normal.  Nursing note and vitals reviewed.   ED Course  Procedures (including critical care time) Labs Review Labs Reviewed - No data to display  Imaging Review No results found.   EKG Interpretation None      MDM  Xray of the right foot is negative for fx. There is noted osteopenia. No temp elevation. No reds streaks. DP and PT 2+. No noted puncture. No fb on xray. Suspect callus formation with irritation. Pt referred to Dr Nolen MuMcKinney - podiatry.   Final diagnoses:  None    *I have reviewed nursing notes, vital signs, and all appropriate lab and imaging results for this patient.Ivery Quale**    Denton Derks, PA-C 02/27/15 1717  Samuel JesterKathleen McManus, DO 02/28/15 2215
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
## 2015-03-02 NOTE — Consult Note (Signed)
PATIENT NAME:  [name], [name] MR#:  [id number] DATE OF BIRTH:  1956-02-13  DATE OF CONSULTATION:  05/04/2014  REFERRING PHYSICIAN:   CONSULTING PHYSICIAN:  Audery AmelJohn T. Clapacs, MD  IDENTIFYING INFORMATION AND REASON FOR CONSULT: A 59 year old woman who brought to the Emergency Room after drinking hand sanitizer at the home where she is living. Consultation for appropriate psychiatric disposition.   HISTORY OF PRESENT ILLNESS: Information obtained from the patient and the chart. The patient admits that she drank some beer and also drank some hand sanitizer. She said she has been drinking for about 40 years and has drunk hand sanitizer before. She denies that in any way she was trying to hurt or kill herself. She said her mood is feeling depressed because she is frustrated with her living situation and her drinking. She denied any psychotic symptoms however and denied any thoughts of wishing to die. The patient would like to be released from the hospital. She told the admitting nurse that she was drinking beer and also drank hand sanitizer. York SpanielSaid that she really never has stayed sober for more than a few months at a time. Drinks intermittently, probably at least several times a week, several beers at a time. Says that she really has not had another mental health diagnosis besides her chronic alcohol abuse and the medical problems related to it.   PAST PSYCHIATRIC HISTORY: The patient denies history of suicidality, denies any history of violence. Describes long-standing history of alcohol abuse. Does think she may have had a seizure in the past. Denies DTs. Currently she is taking Seroquel at bedtime, also taking Lexapro once a day. Full mental health history otherwise not available.   PAST MEDICAL HISTORY: Diabetes, COPD, hypothyroidism, cirrhosis by her report, restless leg syndrome.   SOCIAL HISTORY: Her sister is her legal guardian. The patient is living in a family care home. She has been there for a  fairly short period of time. She [place]. She has relatives that she stays in contact with, at least intermittently.   FAMILY HISTORY: Not stated.   REVIEW OF SYSTEMS: Denies suicidal or homicidal ideation. Says that she does feel depressed. Denies suicidal ideation. Says that her stomach has been a little upset and she is still feeling tired today.   MENTAL STATUS EXAMINATION: Disheveled woman, looks her stated age, cooperative with the interview. Eye contact good. Psychomotor activity slow. Speech decreased in total amount, a little slow but easy to understand. Affect is slightly blunted, not tearful. Mood is stated as being depressed. Thoughts are slow but lucid. No evidence of delusional thinking or loosening of associations. She is oriented to where she is and the year and the current situation. She appears to have intact recent memory for events at home and longer term memory appears to probably be of average intelligence.   LABORATORY RESULTS: Labs were drawn yesterday when she came into the Emergency Room. Drug screen was negative. Chemistry panel: Slightly low sodium 131 and alcohol level 265. CBC: Low platelet count of 147, otherwise unremarkable. Urinalysis: No infection.   VITAL SIGNS: Current blood pressure 120/70, pulse 78, respirations 18, temperature 98.4.   MEDICATIONS: Today the patient has not received any detox medicine during the daytime from what I can tell.   ASSESSMENT: This is a 59 year old woman with chronic alcohol dependence who drank some hand sanitizer because she wanted the alcohol in it. She has done it before and realizes that it is not made for drinking, but did  not think it would harm her because she has had experience with it. She totally denies any suicidal ideation. Although she describes her mood as depressed she is not suicidal, not psychotic and appears to be physically and medically stable. There does not appear to be an acute need for  hospital level treatment.   TREATMENT PLAN: The patient was educated to please not drink hand sanitizer or other forms of alcohol that were not intended for human consumption in the future. In fact, I encouraged her to stop drinking altogether, but if she had to at least do not drink home hygiene products. The patient already has followup medical and mental health treatment locally. She can be released from the Emergency Room.   DIAGNOSIS, PRINCIPAL AND PRIMARY:  AXIS I: Alcohol intoxication, resolved.   SECONDARY DIAGNOSES: AXIS I: Alcohol dependence.  AXIS II: Deferred.  AXIS III: Evidently history of chronic obstructive pulmonary disease by her history, reported to be cirrhosis.  AXIS IV: Moderate to severe from not having her own guardianship.  AXIS V: Functioning at time of discharge and evaluation 45.  ____________________________ Audery Amel, MD jtc:sb D: 05/04/2014 16:22:11 ET T: 05/04/2014 16:56:38 ET JOB#: [id number]  cc: Audery Amel, MD, <Dictator> Audery Amel MD ELECTRONICALLY SIGNED 05/05/2014 1:49

## 2015-03-02 NOTE — Consult Note (Signed)
PATIENT NAME:  [name], [name] MR#:  [id number] DATE OF BIRTH:  February 28, 1956  DATE OF CONSULTATION:  04/08/2014  REFERRING PHYSICIAN:   CONSULTING PHYSICIAN:  Charleigh Correnti K. Jiya Kissinger, MD  SEX:  Female.  RACE:  White.  AGE:  59 years.  SUBJECTIVE:  Patient was seen in consultation in room number 150 and this was a reconsultation as undersigned went to see the patient in CCU14 on 04/07/2014.  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  Patient is married for 20 years but is separated for 15 years and currently she lives in home care in [place], [place].  Patient has been living at [facility] assisted living facility in [place], [place] since February 2015, and when this closed, she was transferred to home care group home in [place], [place].  Patient was brought to the Emergency Room at [facility] after she overdosed herself on hand sanitizer and she did it to get drunk and get high from the same according to information obtained from the staff.  Sister is her guardian and she has been her guardian for more than 10 years.  When patient was asked what is the reason for the sister to be guardian, she reported, "It is a long story.  I don't want to explain."  According to information obtained from the nursing staff, sister stated that patient was talking of suicide and was depressed when she overdosed herself on sanitizer.  Patient was admitted to CCU14 where she was stabilized and then was transferred to room number 150.      PAST PSYCHIATRIC HISTORY:  No previous history of inpatient hold on psychiatry.  No history of suicide attempt.  Patient reports that she was being followed by Dr. Jacqlyn Larsen for several years.     ALCOHOL AND DRUGS:  Admits that she drinks alcohol but in binges.  She had a DWI, got her driver's license back.  Never arrested for public drunkenness.  She used to abuse drugs which includes cocaine and she used to use it IV and snorting and smoking and last  used it many years ago.  Does admit to occasional nicotine cigarettes at a rate of 5 or 6 per day.  MENTAL STATUS:  Today, patient is seen lying down comfortably in bed, very pleasant, calm, and cooperative.  Alert and oriented to place, person, and time.  Affect is appropriate with her mood.  Patient denies feeling depressed.  Denies feeling hopeless or helpless, but according to information obtained, she has been feeling very down and depressed and took an overdose of hand sanitizer.  She reported that she did it to get a high but denies suicidal wishes.  No psychosis.  Denies auditory or visual hallucinations.  Denies hearing voices, seeing things.  Memory and recall are fair.  She could spell the word world forward and backward without any problems.  She could count money.  She knew [place] [address], capital of [place], and name of the current president.  Cognition is intact.  Regarding judgment, for fire she says she would leave.  Insight and judgment guarded.  Impulse control is poor.    IMPRESSION:  Alcohol dependence, chronic, continues.  Substance abuse - sanitizer abuse and overdose in a possible suicide attempt.  Substance-induced mood disorder.  I recommend discontinuation of suicide precautions as patient contracts for safety.  Discontinue one on one observation.  Transfer patient to inpatient psychiatry at a lower level, when patient is medically  cleared and stable and when bed is available.    ____________________________ Jannet MantisSurya K. Guss Bundehalla, MD skc:dd D: 04/08/2014 19:19:00 ET T: 04/08/2014 19:59:27 ET JOB#: [id number]  cc: Monika SalkSurya K. Guss Bundehalla, MD, <Dictator> Beau FannySURYA K Jette Lewan MD ELECTRONICALLY SIGNED 04/10/2014 7:48

## 2015-03-02 NOTE — H&P (Signed)
PATIENT NAME:  [name], [name] MR#:  [id number] DATE OF BIRTH:  09-16-1956  DATE OF ADMISSION:  04/07/2014  PRIMARY CARE PHYSICIAN:  None.   REFERRING PHYSICIAN:  Dr. Jene Everyobert Kinner.   CHIEF COMPLAINT:  Unresponsive.   HISTORY OF PRESENT ILLNESS:  [name] is a 59 year old female with history of mood disorder, alcohol abuse, cirrhosis, hypertension, hyperlipidemia who lives in a group home.  The patient was found to be obtunded.  The patient was only responding to verbal stimuli.  When the patient arrived to the Emergency Department the patient was hypotensive, diaphoretic with agonal respiration.  The patient was intubated, was given 2 liters of IV fluid and started on dopamine drip.  The patient's blood pressure improved.  Emergency Department physician was told that the patient drank unknown amount of hand sensitizer.  Emergency Department discussed with the Poison Control who recommended supportive care.  The patient's blood pressure improved to systolic blood pressure to 140s.  No obvious signs of infection are noted.  Chest x-ray showed increased streaky  nodular density in both lungs.  Has normal white blood cell count.  Unable to obtain any history from the patient as the patient is intubated.   PAST MEDICAL HISTORY: 1.  Anemia.  2.  Hepatitis C.  3.  Alcoholic abuse.  4.  Hypertension.  5.  Mood disorder.  6.  COPD.  7.  Cirrhosis.  ALLERGIES:  Unknown.   HOME MEDICATIONS: 1.  Spiriva 2 puffs once a day.  2.   0.5 mg at bedtime.  3.  Quetiapine 300 mg at bedtime.  4.  ProAir 2 puffs 4 times a day.  5.  Oxycodone 5 mg 3 tablets 4 times a day as needed.  6.  Nadolol 20 mg once a day.  7.  Levothyroxine 75 mcg once a day.  8.  Gabapentin 600 mg 3 times a day.  9.  Escitalopram 20 mg once a day.  10.  Docusate sodium 100 mg 2 times a day.  11.  Benadryl 25 mg 2 times a day as needed.  12.  Atarax 10 mg 3 times a day.  13.  Norco 5/325 mg 2 times a day as needed.   SOCIAL  HISTORY:  Unknown history of smoking, drinking alcohol.  Per medical problems, the patient has a history of alcohol abuse.  Currently lives in a group home.   FAMILY HISTORY AND REVIEW OF SYSTEMS:  Could not be obtained from the patient as the patient is currently intubated.   PHYSICAL EXAMINATION:   GENERAL:  This is a well-built, well-nourished, age-appropriate female lying down in the bed, not in distress. VITAL SIGNS:  Temperature 97.6, pulse 58, blood pressure initially was 82/55, currently 125/80, respiratory rate of 16, oxygen saturation 100% on 100% FiO2.  HEENT:  Head normocephalic, atraumatic.  Eyes, no scleral icterus.  Conjunctivae normal.  Pupils equal and reactive.  Extraocular movements could not examine.  NECK:  Supple.  No lymphadenopathy.  No JVD.  No carotid bruit.  Could not examine the mouth or oropharynx.  CHEST:  Has no focal tenderness.  Some coarse breath sounds in the lower lobes.  HEART:  S1 and S2 regular.  No murmurs are heard.  ABDOMEN:  Bowel sounds plus.  Soft, nontender, nondistended.  EXTREMITIES:  No pedal edema.  Pulses 2+.  NEUROLOGIC:  The patient is not oriented to place, person, and time as the patient is currently intubated.  Could not examine the motor and sensory.  No obvious intracranial abnormalities.  SKIN:  No rash or lesions.  MUSCULOSKELETAL:  Could not examine.   LABORATORY DATA:  ABG, pH of 7.37, pCO2 of 36, PaO2 of 49.  Chest x-ray on view portable:  Nodular opacity bilaterally.  CBC is completely within normal limits.  Urine drug screen is negative.  UA negative for nitrites and leukocyte esterase.  Troponin less than 0.02.  Coag profile is within normal limits.  CMP is completely within normal limits.  Ammonia level of 78.   ASSESSMENT AND PLAN:  [name] is a 59 year old female who drank unknown quantity of hand sanitizer, was found to be unresponsive with agonal breathing. 1.  Respiratory failure.  It is concerning about possible  aspiration as the patient was in agonal breathing.  Keep the patient on vancomycin and Zosyn, treating as a possible aspiration pneumonia.  2.  Possible sepsis.  The patient was hypotensive, unresponsive.  There is a possibility of dehydration.  The patient's initial blood pressure was 71/40 with a heart rate of 56.  The patient is on Nadolol.  This could be causing the low heart rate.  Currently, blood pressure is well within normal limits off of dopamine.  We will treat with antibiotics until cultures are back.  3.  Overdose with hand sanitizer with high concentrate of alcohol.  We will continue to follow up.  Follow up with the liver function tests.  The patient's initial alcohol level was 24.  4.  Underlying psychiatric disorder.  We will hold the medications for now.  We will involve psychiatry concerning about either this could be alcohol abuse versus intentional overdose.  Once the patient will be extubated keep a sitter at bedside.   5.  Keep the patient on deep vein thrombosis prophylaxis with Lovenox.   TIME SPENT:  60 minutes of critical care time.     ____________________________ Susa Griffins, MD pv:ea D: 04/07/2014 01:51:17 ET T: 04/07/2014 04:47:16 ET JOB#: [id number]  cc: Susa Griffins, MD, <Dictator> Susa Griffins MD ELECTRONICALLY SIGNED 04/09/2014 23:21

## 2015-03-02 NOTE — Consult Note (Signed)
PATIENT NAME:  [name], [name] MR#:  [id number] DATE OF BIRTH:  1955/11/13  DATE OF CONSULTATION:    REFERRING PHYSICIAN:   CONSULTING PHYSICIAN:  Doy Taaffe K. Guss Bundehalla, MD  PLACE OF DICTATION:  [facility] [address], [place].  SEX:  Female.  RACE:  White.  AGE:  59 years.  SUBJECTIVE:  Came to the see the patient in CCU 14.  Staff reports that the patient has a long history of alcohol drinking and recently had been drinking sanitizer and in addition was drinking alcohol and was brought here and had to be intubated in the CCU.  Went to see the patient who appears to be very drowsy, but is arousable and she stated "please come back tomorrow."   and un dersigned told her that she will come back tomorrow again.  We will reevaluate the patient.     ____________________________ Jannet MantisSurya K. Guss Bundehalla, MD skc:ea D: 04/07/2014 21:56:00 ET T: 04/08/2014 03:00:07 ET JOB#: [id number]  cc: Monika SalkSurya K. Guss Bundehalla, MD, <Dictator> Beau FannySURYA K Akyra Bouchie MD ELECTRONICALLY SIGNED 04/08/2014 18:40

## 2015-03-02 NOTE — Discharge Summary (Signed)
PATIENT NAME:  [name], Yomara MR#:  [id number] DATE OF BIRTH:  06-Apr-1956  DATE OF ADMISSION:  04/07/2014 DATE OF DISCHARGE:  04/12/2014  ADMITTING DIAGNOSIS: Decrease in responsiveness.   DISCHARGE DIAGNOSES: 1.  Decrease in responsiveness due to overdose with a hand sanitizer.  2.  Acute respiratory failure felt to be due to acute encephalopathy, as well as requiring airway protection, subsequently extubated.  3.  Possible sepsis on admission; however, no source of infection noted.  4.  Toxic metabolic encephalopathy due to hand sanitizer overdose.  5.  Alcohol abuse.  6.  Hypomagnesemia.  7.  Anemia.  8.  Chronic hepatitis C.  9.  Hypertension.  10.  Mood disorder.  11.  Chronic obstructive pulmonary disease.  12.  History of liver cirrhosis.   PERTINENT LABS AND EVALUATIONS:  Glucose 89, BUN 12, creatinine 0.70, sodium 131, potassium 3.7, chloride 100, CO2 is 22, calcium of 8.3. Magnesium of 1.4. Ammonia level was 78. Alcohol level 0.240. LFTs: Total protein 6.7, albumin 3.3,  bili total 0.3, AST 42, ALT 26. Troponin less than 0.02.  Toxic urine drug screen was negative. WBC 5.0, hemoglobin 11.8. Platelet count is 191. INR 1.2. Chest x-ray shows low lung volumes. Apical respiratory density.   HOSPITAL COURSE:  Please refer to H and P done by the admitting physician. The patient is a 59 year old white female with history of alcohol abuse, liver cirrhosis, history of hepatitis C, who was brought in from a family care home due to decrease in responsiveness. The patient had altered mental status on presentation and required intubation due to protection of airways. The patient was intubated. She was noted to have overdose on hand sanitizer with high alcohol level. Poison Control was called and they recommended supportive care and monitoring of her LFTs. There was some question whether she had some withdrawal and some seizures, withdrawal related to her alcohol abuse in the past; however, this is  not clear. At this point, she is doing much better. She was seen by psychiatry. They did not feel that she needed inpatient psychiatric admission. At this time, she is doing much better. She is arranged for a skilled nursing facility.   DISCHARGE MEDICATIONS: Levothyroxine 75 mcg daily, quetiapine 300 mg at bedtime, Lexapro 20 daily, Spiriva 2 puffs daily, ProAir 2 puffs 4 times a day as needed, gabapentin 600, 1 tab p.o. t.i.d.; Benadryl 25 b.i.d. as needed, Colace 100, 1 tab p.o. b.i.d.; Atarax 10 mg 3 times a day as needed for itching, acetaminophen/hydrocodone 325/5, 1 tab p.o. b.i.d. as needed; Requip 0.5 at bedtime, magnesium oxide 400, 1 tab p.o. b.i.d.   DIET: Regular.   ACTIVITY: As tolerated.   Follow up with primary M.D. in 1 to 2 weeks.   NOTE: Time spent 35 minutes on the discharge.   ____________________________ Lacie ScottsShreyang H. Allena KatzPatel, MD shp:dmm D: 04/12/2014 11:38:26 ET T: 04/12/2014 12:01:03 ET JOB#: [id number]  cc: Larwence Tu H. Allena KatzPatel, MD, <Dictator> Charise CarwinSHREYANG H Nakima Fluegge MD ELECTRONICALLY SIGNED 04/14/2014 14:31

## 2015-07-29 ENCOUNTER — Encounter: Payer: Self-pay | Admitting: *Deleted

## 2015-07-29 ENCOUNTER — Emergency Department
Admission: EM | Admit: 2015-07-29 | Discharge: 2015-07-30 | Disposition: A | Discharge status: Home / Self Care | Payer: Medicaid Other | Attending: Emergency Medicine | Admitting: Emergency Medicine

## 2015-07-29 DIAGNOSIS — Z720 Tobacco use: Secondary | ICD-10-CM | POA: Insufficient documentation

## 2015-07-29 DIAGNOSIS — Z7901 Long term (current) use of anticoagulants: Secondary | ICD-10-CM | POA: Insufficient documentation

## 2015-07-29 DIAGNOSIS — R079 Chest pain, unspecified: Secondary | ICD-10-CM | POA: Diagnosis not present

## 2015-07-29 DIAGNOSIS — Z7952 Long term (current) use of systemic steroids: Secondary | ICD-10-CM | POA: Insufficient documentation

## 2015-07-29 DIAGNOSIS — Z79899 Other long term (current) drug therapy: Secondary | ICD-10-CM | POA: Diagnosis not present

## 2015-07-29 NOTE — ED Provider Notes (Signed)
[facility] Emergency Department Provider Note  ____________________________________________  Time seen: 11:15 PM  I have reviewed the triage vital signs and the nursing notes.   HISTORY  Chief Complaint Chest Pain     HPI [name] is a 59 y.o. female presents with history of "slight chest pain " that was central all day today. Patient denies any shortness of breath no diaphoresis no dizziness. Patient states she was in route to the [place] homeless shelter but was notified and it was closed and as such decided to be evaluated for chest pain. Patient also admits to EtOH tonight of unknown amount. Patient describes chest pain at present as's mild stabbing discomfort.     Past Medical History  Diagnosis Date  . Hepatic sclerosis   . Neuropathy   . COPD (chronic obstructive pulmonary disease)   . Asthma   . Thyroid disease     Patient Active Problem List   Diagnosis Date Noted  . Septic shock 06/16/2014  . HCAP (healthcare-associated pneumonia) 06/16/2014  . Leukocytosis 06/16/2014    Past Surgical History  Procedure Laterality Date  . Radical hysterectomy      Current Outpatient Rx  Name  Route  Sig  Dispense  Refill  . acetaminophen-codeine (TYLENOL #3) 300-30 MG per tablet   Oral   Take 1-2 tablets by mouth every 6 (six) hours as needed for moderate pain.   15 tablet   0   . albuterol (PROAIR HFA) 108 (90 BASE) MCG/ACT inhaler   Inhalation   Inhale 1-2 puffs into the lungs every 6 (six) hours as needed for wheezing or shortness of breath.         Marland Kitchen albuterol (PROVENTIL) (2.5 MG/3ML) 0.083% nebulizer solution   Nebulization   Take 2.5 mg by nebulization 4 (four) times daily as needed for wheezing or shortness of breath.         . DiphenhydrAMINE HCl (COMPLETE ALLERGY PO)   Oral   Take 2 tablets by mouth daily as needed (for allergies).         . docusate sodium (COLACE) 100 MG capsule   Oral   Take 100 mg by mouth  2 (two) times daily as needed for mild constipation.         Marland Kitchen escitalopram (LEXAPRO) 20 MG tablet   Oral   Take 20 mg by mouth daily.         Marland Kitchen gabapentin (NEURONTIN) 600 MG tablet   Oral   Take 600 mg by mouth 3 (three) times daily.         Marland Kitchen levofloxacin (LEVAQUIN) 750 MG tablet   Oral   Take 1 tablet (750 mg total) by mouth daily.   5 tablet   0   . levothyroxine (SYNTHROID, LEVOTHROID) 75 MCG tablet   Oral   Take 75 mcg by mouth at bedtime.          Marland Kitchen LORazepam (ATIVAN) 0.5 MG tablet   Oral   Take 0.5 mg by mouth 3 (three) times daily.         . magnesium oxide (MAG-OX) 400 MG tablet   Oral   Take 400 mg by mouth 2 (two) times daily.         Marland Kitchen oxyCODONE-acetaminophen (ROXICET) 5-325 MG per tablet   Oral   Take 1 tablet by mouth every 4 (four) hours as needed for severe pain.   20 tablet   0   . predniSONE (DELTASONE) 5 MG tablet  Oral   Take 1 tablet (5 mg total) by mouth daily with breakfast.           Taper dose:  po x 3 days, then  po x 3 day ...   . QUEtiapine (SEROQUEL) 300 MG tablet   Oral   Take 300 mg by mouth at bedtime.         Marland Kitchen rOPINIRole (REQUIP) 0.5 MG tablet   Oral   Take 0.5 mg by mouth at bedtime.         . Tiotropium Bromide Monohydrate (SPIRIVA RESPIMAT) 2.5 MCG/ACT AERS   Inhalation   Inhale 2 puffs into the lungs daily.         Marland Kitchen warfarin (COUMADIN) 4 MG tablet   Oral   Take 8 mg by mouth daily.           Allergies Ivp dye  History reviewed. No pertinent family history.  Social History Social History  Substance Use Topics  . Smoking status: Current Every Day Smoker -- 0.50 packs/day for 25 years    Types: Cigarettes  . Smokeless tobacco: Never Used  . Alcohol Use: Yes     Comment: occ    Review of Systems  Constitutional: Negative for fever. Eyes: Negative for visual changes. ENT: Negative for sore throat. Cardiovascular: Positive for chest pain. Respiratory: Negative for shortness  of breath. Gastrointestinal: Negative for abdominal pain, vomiting and diarrhea. Genitourinary: Negative for dysuria. Musculoskeletal: Negative for back pain. Skin: Negative for rash. Neurological: Negative for headaches, focal weakness or numbness.   10-point ROS otherwise negative.  ____________________________________________   PHYSICAL EXAM:  VITAL SIGNS: ED Triage Vitals  Enc Vitals Group     BP 07/29/15 2252 135/85 mmHg     Pulse Rate 07/29/15 2252 66     Resp 07/29/15 2252 18     Temp 07/29/15 2252 98.4 F (36.9 C)     Temp Source 07/29/15 2252 Oral     SpO2 07/29/15 2243 98 %     Weight 07/29/15 2250 125 lb (56.7 kg)     Height 07/29/15 2250  (1.626 m)     Head Cir --      Peak Flow --      Pain Score --      Pain Loc --      Pain Edu? --      Excl. in GC? --     Constitutional: Alert and oriented. Well appearing and in no distress. Eyes: Conjunctivae are normal. PERRL. Normal extraocular movements. ENT   Head: Normocephalic and atraumatic.   Nose: No congestion/rhinnorhea.   Mouth/Throat: Mucous membranes are moist.   Neck: No stridor. Hematological/Lymphatic/Immunilogical: No cervical lymphadenopathy. Cardiovascular: Normal rate, regular rhythm. Normal and symmetric distal pulses are present in all extremities. No murmurs, rubs, or gallops. Respiratory: Normal respiratory effort without tachypnea nor retractions. Breath sounds are clear and equal bilaterally. No wheezes/rales/rhonchi. Gastrointestinal: Soft and nontender. No distention. There is no CVA tenderness. Genitourinary: deferred Musculoskeletal: Nontender with normal range of motion in all extremities. No joint effusions.  No lower extremity tenderness nor edema. Neurologic:  Normal speech and language. No gross focal neurologic deficits are appreciated. Speech is normal.  Skin:  Skin is warm, dry and intact. No rash noted. Psychiatric: Mood and affect are normal. Speech and  behavior are normal. Patient exhibits appropriate insight and judgment.  ____________________________________________    LABS (pertinent positives/negatives)  Labs Reviewed  CBC - Abnormal; Notable for the following:  RBC 3.52 (*)    Hemoglobin 11.0 (*)    HCT 32.4 (*)    Platelets 124 (*)    All other components within normal limits  COMPREHENSIVE METABOLIC PANEL - Abnormal; Notable for the following:    Sodium 130 (*)    Chloride 97 (*)    Calcium 8.8 (*)    All other components within normal limits  ETHANOL - Abnormal; Notable for the following:    Alcohol, Ethyl (B) 143 (*)    All other components within normal limits  TROPONIN I  TROPONIN I     ____________________________________________   EKG  ED ECG REPORT I, BROWN, Hiller N, the attending physician, personally viewed and interpreted this ECG.   Date: 07/30/2015  EKG Time: 11:56 PM  Rate: 60  Rhythm: Normal sinus rhythm  Axis: None  Intervals: Normal  ST&T Change:        INITIAL IMPRESSION / ASSESSMENT AND PLAN / ED COURSE  Pertinent labs & imaging results that were available during my care of the patient were reviewed by me and considered in my medical decision making (see chart for details).  EKG revealed no evidence of myocardial infarction, cardiac enzymes negative 2  ____________________________________________   FINAL CLINICAL IMPRESSION(S) / ED DIAGNOSES  Final diagnoses:  Chest pain, unspecified chest pain type       Darci Current, MD 07/30/15 [phone]

## 2015-07-29 NOTE — ED Notes (Signed)
Per EMS pt was c/o chest pain today after walking to [place] then unable to get into shelter,  Pt admits to drinking today. EMS gave [facility]., PIV established.

## 2015-07-30 LAB — COMPREHENSIVE METABOLIC PANEL
ALT: 35 U/L (ref 14–54)
ANION GAP: 10 (ref 5–15)
AST: 36 U/L (ref 15–41)
Albumin: 4 g/dL (ref 3.5–5.0)
Alkaline Phosphatase: 74 U/L (ref 38–126)
BILIRUBIN TOTAL: 0.5 mg/dL (ref 0.3–1.2)
BUN: 17 mg/dL (ref 6–20)
CALCIUM: 8.8 mg/dL — AB (ref 8.9–10.3)
CO2: 23 mmol/L (ref 22–32)
Chloride: 97 mmol/L — ABNORMAL LOW (ref 101–111)
Creatinine, Ser: 0.64 mg/dL (ref 0.44–1.00)
Glucose, Bld: 97 mg/dL (ref 65–99)
POTASSIUM: 3.7 mmol/L (ref 3.5–5.1)
Sodium: 130 mmol/L — ABNORMAL LOW (ref 135–145)
TOTAL PROTEIN: 7.1 g/dL (ref 6.5–8.1)

## 2015-07-30 LAB — CBC
HEMATOCRIT: 32.4 % — AB (ref 35.0–47.0)
Hemoglobin: 11 g/dL — ABNORMAL LOW (ref 12.0–16.0)
MCH: 31.1 pg (ref 26.0–34.0)
MCHC: 33.8 g/dL (ref 32.0–36.0)
MCV: 91.9 fL (ref 80.0–100.0)
Platelets: 124 10*3/uL — ABNORMAL LOW (ref 150–440)
RBC: 3.52 MIL/uL — ABNORMAL LOW (ref 3.80–5.20)
RDW: 13.6 % (ref 11.5–14.5)
WBC: 6.1 10*3/uL (ref 3.6–11.0)

## 2015-07-30 LAB — ETHANOL: Alcohol, Ethyl (B): 143 mg/dL — ABNORMAL HIGH (ref <5)

## 2015-07-30 LAB — TROPONIN I

## 2015-07-30 MED ORDER — NICOTINE 10 MG IN INHA
1.0000 | RESPIRATORY_TRACT | Status: DC | PRN
  Administered 2015-07-30: 1 via RESPIRATORY_TRACT
  Filled 2015-07-30: qty 36

## 2015-07-30 NOTE — ED Notes (Signed)
Jonte Joe, patient's gaurdian with the [employer] gave instructions to send patient to the [facility] with a taxi voucher.

## 2015-07-30 NOTE — ED Notes (Addendum)
In to talk with pt, pt stated that the state was her guardian.  Pt states that she was in jail until yesterday and that her guardian picked her up from jail and dropped her off at the homeless shelter, there was no rooms at the shelter so pt went to the store and started drinking. Pt was living in a group home prior to going to jail. Pt cannot go back to that facility due to simple assault charges. Pt states that her FL2 has expired and that she needs a new one to get into another facility.  Pt has been calm and cooperative with staff here in the ER, she is just scared to be put back put on the street. Pt states that she has been under guardianship for 14 years and that her sister was her guardian up until this summer.  Pt is wanting help with group home placement.  Pts Guardian is Kayleen Memos [phone]

## 2015-07-30 NOTE — Progress Notes (Signed)
LCSW completed rts application for patient and faxed it over to [facility]. Will await to see if patient was accepted.

## 2015-07-30 NOTE — ED Notes (Signed)
Per Elnita Maxwell (case Production designer, theatre/television/film) that spoke with patient's guardian at length, guardian states that patient is competent to make her own decisions at this time.

## 2015-07-30 NOTE — ED Notes (Signed)
Spoke to [place] at [facility] regarding a discharge of the patient to the shelter.  [name] stated patient does not qualify to be housed at the [facility].  He was unable to outline why she does not qualify as he is currently out of town.

## 2015-07-30 NOTE — Care Management Note (Signed)
Case Management Note  Patient Details  Name: [name] MRN: [id number] Date of Birth: Oct 04, 1956  Subjective/Objective:   Spoke to the pt. [name], [phone]. The pt. Per her refused all other placement that was offered, the pt. Denies this, but in the end the only place yesterday was the [facility]. The guardian states the pt. Was to be assessed by [facility]. [facility] for substance abuse.  It appears they were not able to talk to the person at the shelter about this after waiting several hours. At this point I have explained to [name] that we cannot keep the pt., nor can we place her in assisted living. She understands this, and I have asked the MD to have the pt. assessed by behavioral  For placement.  She will be here to help with the pt. final disposition  later this morning.  The pt. Has been made aware that she cannot stay, and may be discharged back to the shelter. The pt. Will be assessed first. The RN for the pt. Has also been made aware.            Action/Plan:   Expected Discharge Date:                  Expected Discharge Plan:     In-House Referral:     Discharge planning Services     Post Acute Care Choice:    Choice offered to:     DME Arranged:    DME Agency:     HH Arranged:    HH Agency:     Status of Service:     Medicare Important Message Given:    Date Medicare IM Given:    Medicare IM give by:    Date Additional Medicare IM Given:    Additional Medicare Important Message give by:     If discussed at Long Length of Stay Meetings, dates discussed:    Additional Comments:  Berna Bue, RN 07/30/2015, 7:12 AM

## 2015-07-30 NOTE — ED Notes (Signed)
Clinical social worker at bedside to discuss resources with patient.

## 2015-07-30 NOTE — BHH Counselor (Signed)
Per ER Staff(Stephanie, RN) and Social Work (Claudine) the pt. doesn't need to be seen by [facility]. Social work is assisting with placement and Pt is being referred to [facility].

## 2015-07-30 NOTE — Discharge Instructions (Signed)

## 2015-07-30 NOTE — ED Provider Notes (Signed)
Patient was cleared by Dr. Manson Passey medically for chest pain however, remains in the emergency department because of possible rehabilitation placement.  ----------------------------------------- 8:27 AM on 07/30/2015 ----------------------------------------- Patient's guardian,Jonte Joe, is at the bedside at this point and says the patient may be discharged back to her shelter. The patient has been seen by case management here in the emergency department who also discussed with [name] the circumstances of the patient's placement process. The patient was with her guardian yesterday who is attempting to get her placed, however the patient eloped after prolonged wait. The patient denies any suicidal or homicidal ideation.  Physical Exam  BP 124/76 mmHg  Pulse 60  Temp(Src) 98.4 F (36.9 C) (Oral)  Resp 20  Ht  (1.626 m)  Wt 125 lb (56.7 kg)  BMI 21.45 kg/m2  SpO2 98%  Physical Exam patient is resting comfortable at this time. No outward signs of pain. No tremulousness, or vital signs indicating acute withdrawal.Procedures  MDM Patient will be discharged back to her shelter. She is not exhibiting any signs of acute withdrawal or psychiatric condition that would require further workup or inpatient commitment at this time.      Myrna Blazer, MD 07/30/15 [phone]

## 2015-07-30 NOTE — ED Notes (Signed)
Spoke with Claudine, ED social worker regarding patient situation.  States she will come down and talk to patient, give resources and bus ticket to [place].  Patient states she does not want to stay in [place].

## 2015-07-30 NOTE — ED Notes (Signed)
Report received from Loyal, Charity fundraiser. Pt is resting quietly on stretcher with eyes closed and lights dimmed in room. Denies pain at this time. No acute distress noted. Will continue to monitor.

## 2015-07-30 NOTE — Progress Notes (Signed)
LCSW consulted with ED staff and patients guardian from [facility], she reported she was at [place] but patient decided she would run off with a new found friend. Guardian then had patient brought to hospital. Guardian was here this morning and was under the impression patient would be seen and then proceed to detox. LCSW  Met with [facility] and  nurses and consulted with guardian. The plan is [facility] will fax over patients information to [facility]. Patient will remain in our hallway for 2 hours, she will go over to [facility].

## 2015-07-30 NOTE — BHH Counselor (Signed)
Pt not accepted to [facility] due to having a guardian.

## 2015-07-30 NOTE — Care Management Note (Signed)
Case Management Note  Patient Details  Name: [name] MRN: [id number] Date of Birth: 09-29-1956  Subjective/Objective:   [name] , the pt . Guardian was here. Spoke to her and the pt. Together. The pt. Is to be discharged by taxi to the [facility] here in [place]. The charge RN will issue that , and the pt. Can leave. Ms Gabriel Rung is her guardian through [place].                 Action/Plan:   Expected Discharge Date:                  Expected Discharge Plan:     In-House Referral:     Discharge planning Services     Post Acute Care Choice:    Choice offered to:     DME Arranged:    DME Agency:     HH Arranged:    HH Agency:     Status of Service:     Medicare Important Message Given:    Date Medicare IM Given:    Medicare IM give by:    Date Additional Medicare IM Given:    Additional Medicare Important Message give by:     If discussed at Long Length of Stay Meetings, dates discussed:    Additional Comments:  Berna Bue, RN 07/30/2015, 8:24 AM

## 2015-07-30 NOTE — ED Notes (Signed)
This writer attempted to call the pts guardian and got voice mail.  Stating that if it was an emergency to call Britt Bottom at [phone].  Britt Bottom was contacted and stated that he was have to get with the pts guardian in the AM, to see what he plan of care was going to be.  Britt Bottom was informed that we could not d/c her due to her being under guardian ship.  He stated that he was have her call back in the AM to see about someone picking the pt up.  Will let case worker know that pt is here and is needing an FL2 for placement, to see if they can help.

## 2015-07-30 NOTE — ED Notes (Signed)
Pt observed sleeping soundly on stretcher at this time. Respirations are even and unlabored. No acute distress noted. Will continue to monitor.

## 2016-04-30 IMAGING — CR DG KNEE COMPLETE 4+V*R*
4 series · 4 of 4 positions shown · non-contrast
Comparison: None.

CLINICAL DATA: Fall with right knee pain.

EXAM:
RIGHT KNEE - COMPLETE 4+ VIEW

[view not recorded (1 of 4)]
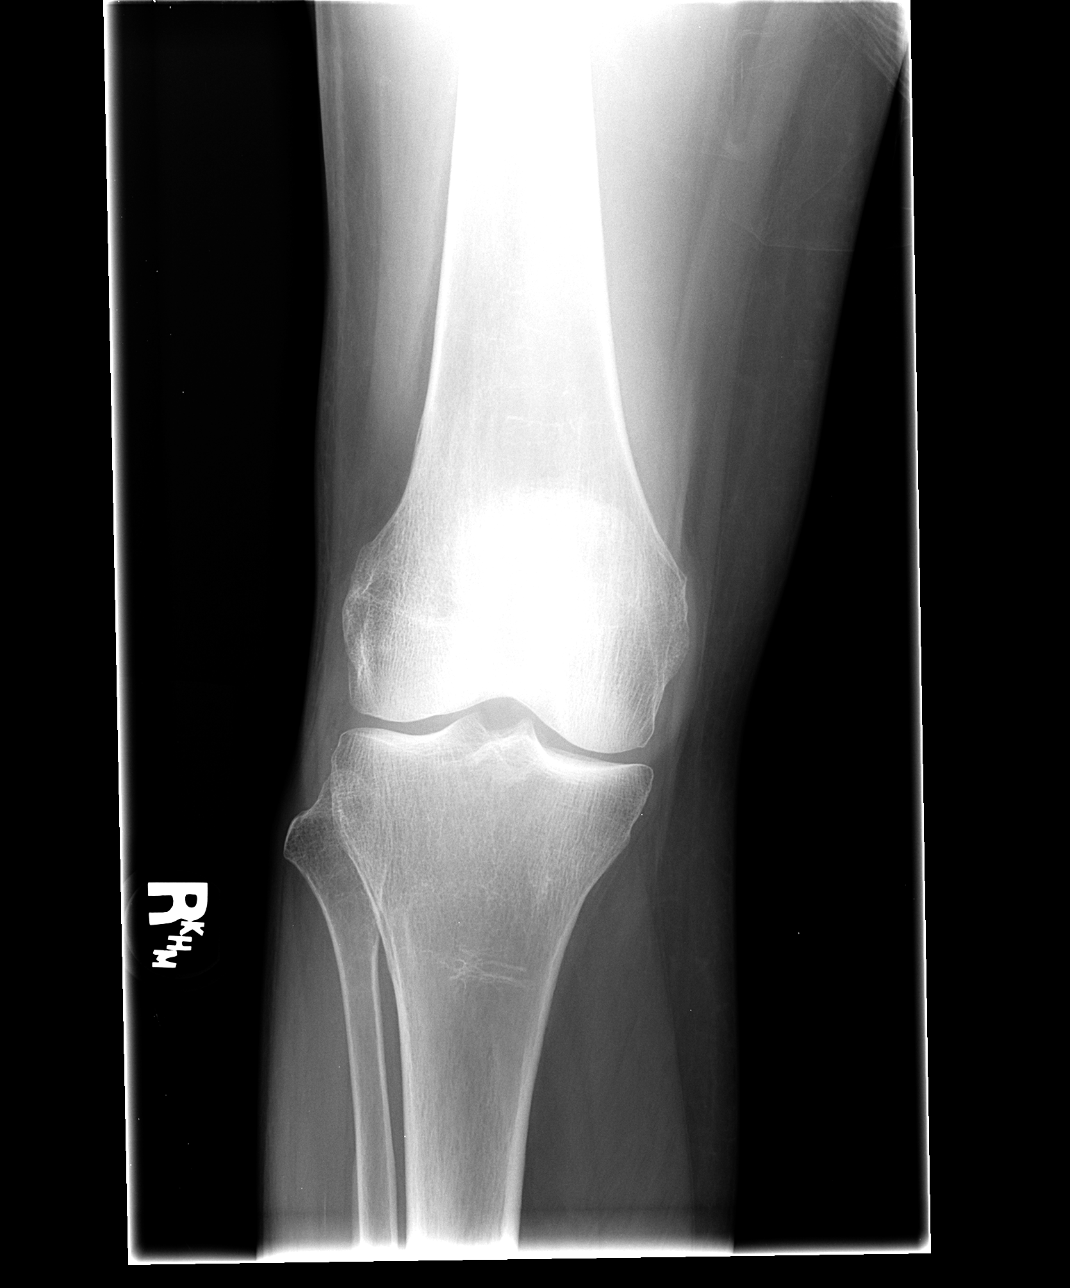

[view not recorded (2 of 4)]
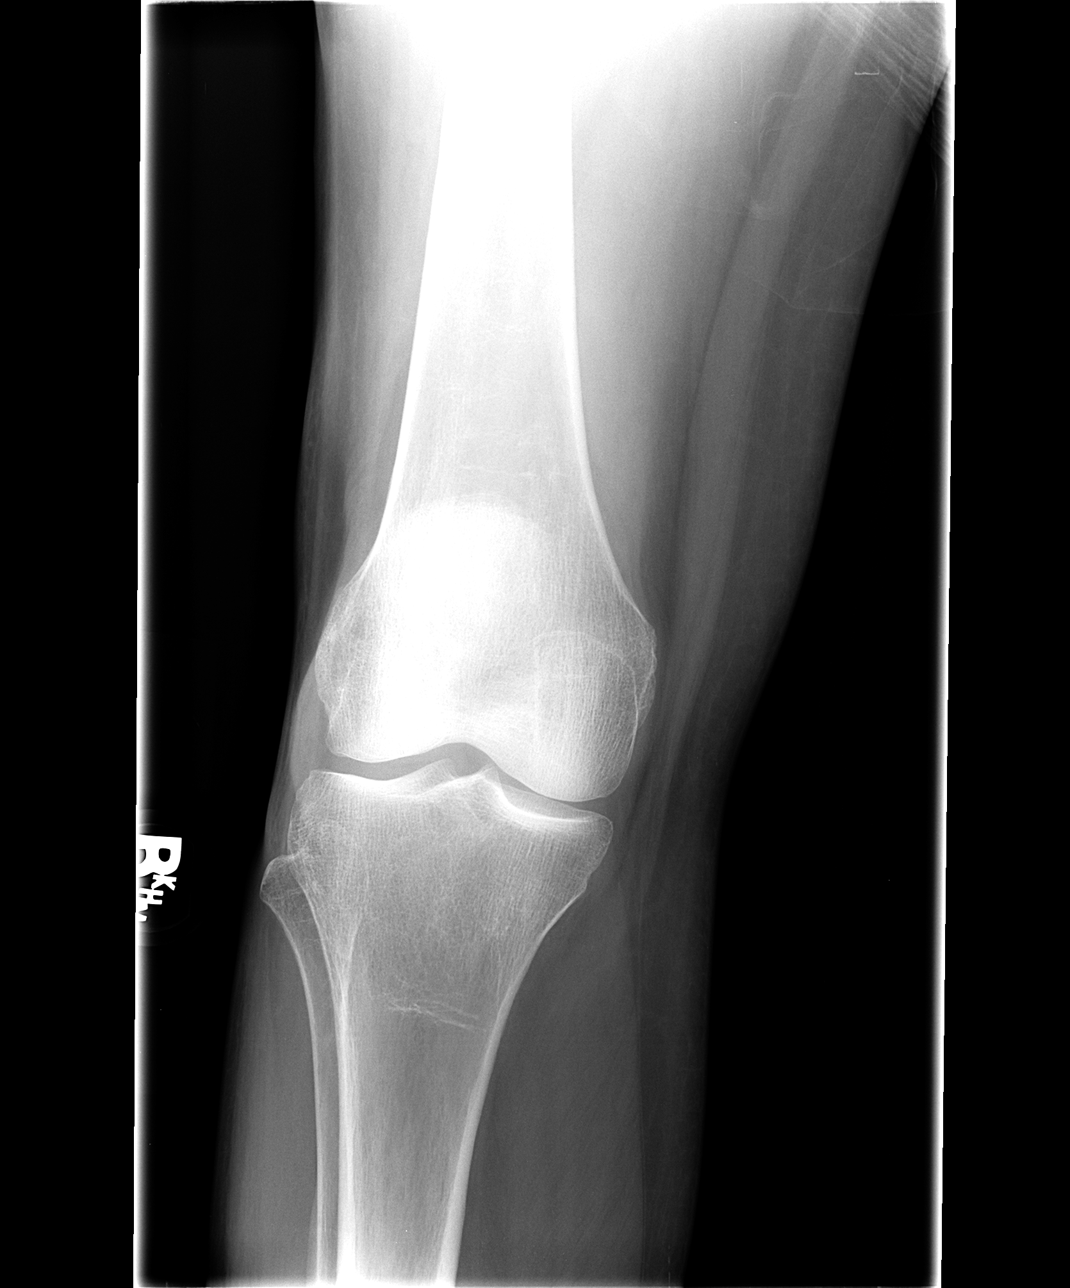

[view not recorded (3 of 4)]
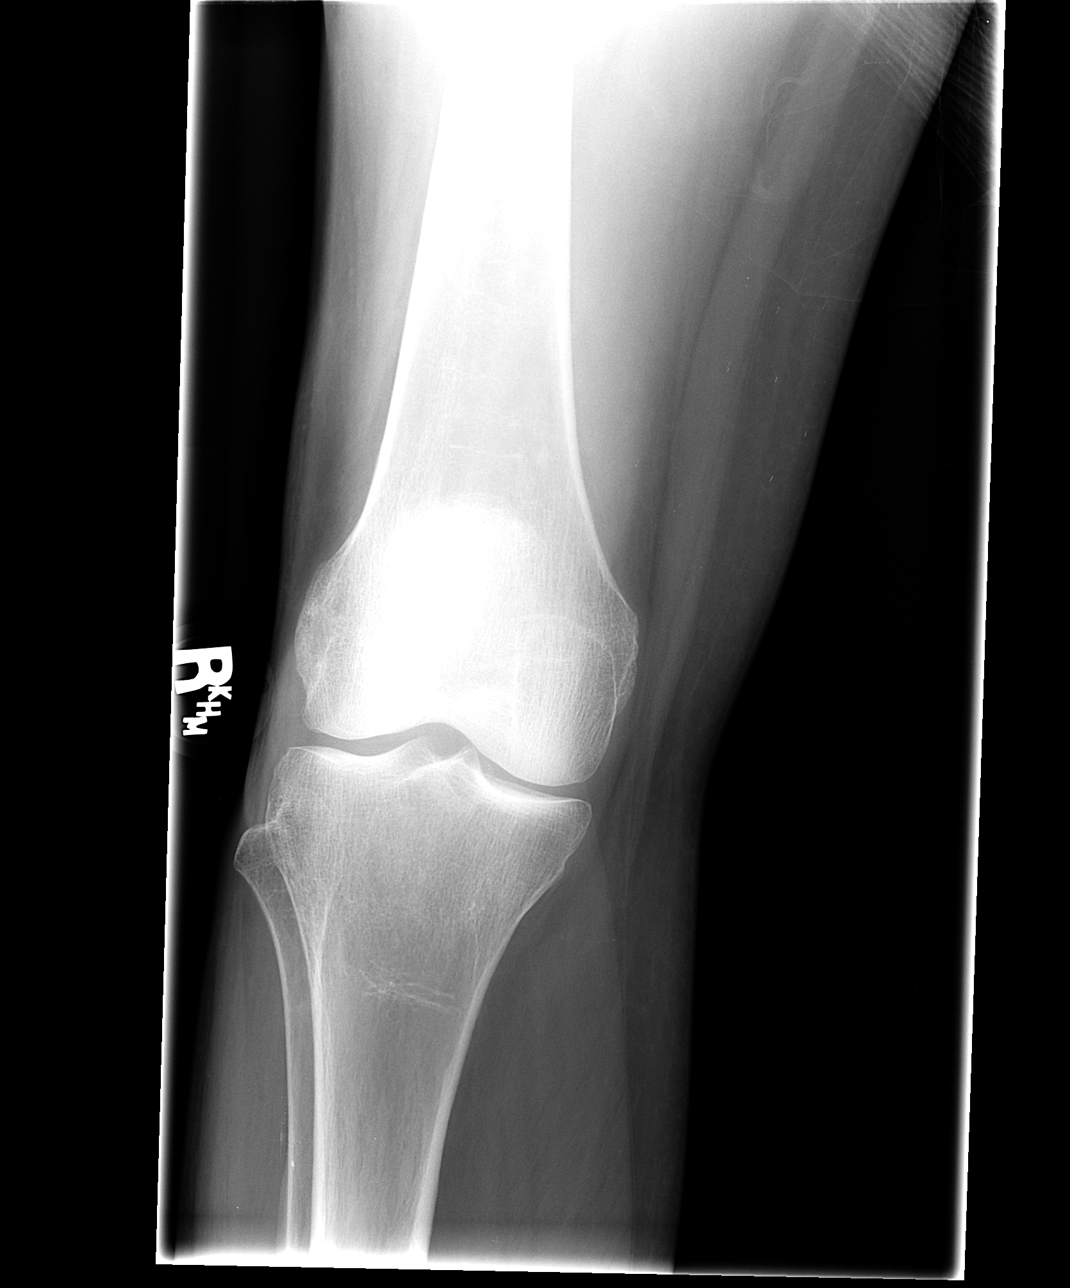

[view not recorded (4 of 4)]
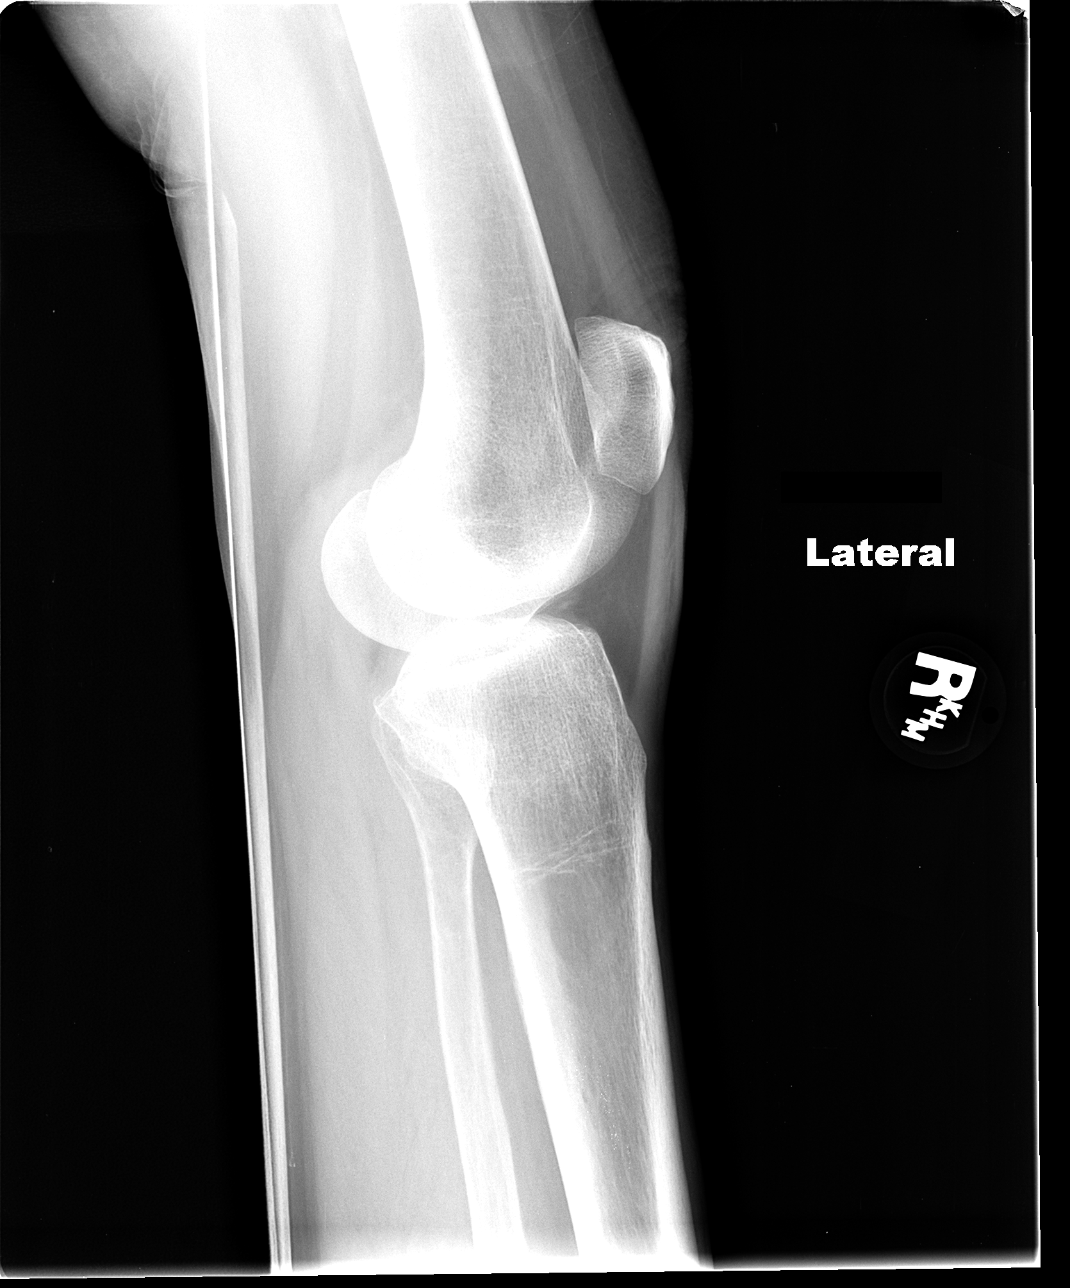

[4 of 4 positions shown; findings below may reference images not displayed]

FINDINGS: No acute fracture, dislocation or joint effusion is identified.
There is mild medial joint space narrowing. No bony lesions.
IMPRESSION: No acute fracture.

## 2016-10-15 ENCOUNTER — Ambulatory Visit: Payer: Self-pay | Admitting: Nurse Practitioner
